# Patient Record
Sex: Male | Born: 1980 | Race: White | Hispanic: No | Smoking: Former smoker
Health system: Southern US, Community
[De-identification: ages and names within clinical notes are randomized; demographics above are authoritative.]

## PROBLEM LIST (undated history)

## (undated) DIAGNOSIS — E785 Hyperlipidemia, unspecified: Secondary | ICD-10-CM

## (undated) DIAGNOSIS — I10 Essential (primary) hypertension: Secondary | ICD-10-CM

## (undated) DIAGNOSIS — J301 Allergic rhinitis due to pollen: Secondary | ICD-10-CM

## (undated) HISTORY — DX: Allergic rhinitis due to pollen: J30.1

## (undated) HISTORY — DX: Essential (primary) hypertension: I10

## (undated) HISTORY — DX: Hyperlipidemia, unspecified: E78.5

---

## 2000-02-21 ENCOUNTER — Emergency Department (HOSPITAL_COMMUNITY): Admission: EM | Admit: 2000-02-21 | Discharge: 2000-02-21 | Payer: Self-pay | Admitting: Emergency Medicine

## 2000-02-21 ENCOUNTER — Encounter: Payer: Self-pay | Admitting: *Deleted

## 2000-04-19 HISTORY — PX: KNEE ARTHROSCOPY: SUR90

## 2000-05-20 LAB — HM HEPATITIS C SCREENING LAB: HM Hepatitis Screen: POSITIVE

## 2000-05-20 LAB — HM HIV SCREENING LAB: HM HIV Screening: NEGATIVE

## 2004-05-12 ENCOUNTER — Encounter: Admission: RE | Admit: 2004-05-12 | Discharge: 2004-06-24 | Payer: Self-pay | Admitting: Internal Medicine

## 2004-06-19 ENCOUNTER — Ambulatory Visit: Payer: Self-pay | Admitting: Internal Medicine

## 2005-01-02 ENCOUNTER — Ambulatory Visit: Payer: Self-pay | Admitting: Internal Medicine

## 2005-06-05 ENCOUNTER — Ambulatory Visit: Payer: Self-pay | Admitting: Family Medicine

## 2007-07-28 ENCOUNTER — Telehealth (INDEPENDENT_AMBULATORY_CARE_PROVIDER_SITE_OTHER): Payer: Self-pay | Admitting: *Deleted

## 2007-07-29 ENCOUNTER — Ambulatory Visit: Payer: Self-pay | Admitting: Internal Medicine

## 2007-07-29 LAB — CONVERTED CEMR LAB
Bilirubin Urine: NEGATIVE
Blood in Urine, dipstick: NEGATIVE
Glucose, Urine, Semiquant: NEGATIVE
Ketones, urine, test strip: NEGATIVE
Nitrite: NEGATIVE
Specific Gravity, Urine: >=1.03
Urobilinogen, UA: 0.2
WBC Urine, dipstick: NEGATIVE
pH: 5

## 2007-09-27 ENCOUNTER — Ambulatory Visit: Payer: Self-pay | Admitting: Internal Medicine

## 2007-09-27 LAB — CONVERTED CEMR LAB
Albumin: 4.1 g/dL (ref 3.5–5.2)
BUN: 8 mg/dL (ref 6–23)
CO2: 29 meq/L (ref 19–32)
Calcium: 9.8 mg/dL (ref 8.4–10.5)
Chloride: 104 meq/L (ref 96–112)
Creatinine, Ser: 1 mg/dL (ref 0.4–1.5)
GFR calc Af Amer: 114 mL/min
GFR calc non Af Amer: 95 mL/min
Glucose, Bld: 86 mg/dL (ref 70–99)
Phosphorus: 4.2 mg/dL (ref 2.3–4.6)
Potassium: 4.1 meq/L (ref 3.5–5.1)
Sodium: 141 meq/L (ref 135–145)

## 2007-12-05 ENCOUNTER — Emergency Department (HOSPITAL_COMMUNITY): Admission: EM | Admit: 2007-12-05 | Discharge: 2007-12-05 | Payer: Self-pay | Admitting: Family Medicine

## 2008-03-27 ENCOUNTER — Ambulatory Visit: Payer: Self-pay | Admitting: Internal Medicine

## 2008-10-09 ENCOUNTER — Telehealth (INDEPENDENT_AMBULATORY_CARE_PROVIDER_SITE_OTHER): Payer: Self-pay | Admitting: *Deleted

## 2008-10-12 ENCOUNTER — Ambulatory Visit: Payer: Self-pay | Admitting: Internal Medicine

## 2012-07-15 ENCOUNTER — Telehealth: Payer: Self-pay | Admitting: Internal Medicine

## 2012-07-15 NOTE — Telephone Encounter (Signed)
Patient advised.

## 2012-07-15 NOTE — Telephone Encounter (Signed)
Patient Information:  Caller Name: Jerry Leach  Phone: [phone]  Patient: Jerry Leach,   Gender: Male  DOB: 11-01-1980  Age: 31 Years  PCP: [facility])  Office Follow Up:  Does the office need to follow up with this patient?: No  Instructions For The Office: N/A  RN Note:  Advised that Shingles Vaccine not recommended in this case. No direct exposure to rash and healthy young adult.  Symptoms  Reason For Call & Symptoms: Calling to find out if he is at risk for contracting Shingles from his wife who was started with the rash on 07/12/12. Wondering if he needs Shingles Vaccine. He has had Chicken Pox as a child.  Reviewed Health History In EMR: Yes  Reviewed Medications In EMR: Yes  Reviewed Allergies In EMR: Yes  Reviewed Surgeries / Procedures: Yes  Date of Onset of Symptoms: 07/15/2012  Guideline(s) Used:  No Protocol Available - Information Only  Disposition Per Guideline:   Home Care  Reason For Disposition Reached:   Information only question and nurse able to answer  Advice Given:  N/A

## 2012-07-15 NOTE — Telephone Encounter (Signed)
Please let him know that you can only catch chicken pox from someone with shingles if you haven't had it yourself. You don't catch shingles from someone (it is your own recurrence in one spot of your old chicken pox infection)

## 2013-08-21 ENCOUNTER — Emergency Department (HOSPITAL_COMMUNITY)
Admission: EM | Admit: 2013-08-21 | Discharge: 2013-08-21 | Disposition: A | Discharge status: Home / Self Care | Payer: 59 | Source: Home / Self Care | Attending: Family Medicine | Admitting: Family Medicine

## 2013-08-21 ENCOUNTER — Encounter (HOSPITAL_COMMUNITY): Payer: Self-pay | Admitting: Emergency Medicine

## 2013-08-21 DIAGNOSIS — J329 Chronic sinusitis, unspecified: Secondary | ICD-10-CM

## 2013-08-21 DIAGNOSIS — B9789 Other viral agents as the cause of diseases classified elsewhere: Secondary | ICD-10-CM

## 2013-08-21 MED ORDER — AZITHROMYCIN 250 MG PO TABS: 250.0000 mg | ORAL_TABLET | Freq: Every day | ORAL | Status: DC

## 2013-08-21 MED ORDER — PREDNISONE 10 MG PO TABS: 30.0000 mg | ORAL_TABLET | Freq: Every day | ORAL | Status: DC

## 2013-08-21 MED ORDER — IPRATROPIUM BROMIDE 0.06 % NA SOLN: 2.0000 | Freq: Four times a day (QID) | NASAL | Status: DC

## 2013-08-21 NOTE — ED Provider Notes (Signed)
Jerry Leach is a 33 y.o. male who presents to Urgent Care today for sinus pain and pressure and nasal congestion for the past one week. Seems to be worsening recently. Patient is trying Claritin which has not helped very much. 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 He denies any significant sore throat or cough nausea vomiting diarrhea or trouble breathing.   History reviewed. No pertinent past medical history. History  Substance Use Topics  . Smoking status: Never Smoker   . Smokeless tobacco: Not on file  . Alcohol Use: Yes   ROS as above Medications: No current facility-administered medications for this encounter.   Current Outpatient Prescriptions  Medication Sig Dispense Refill  . azithromycin (ZITHROMAX) 250 MG tablet Take 1 tablet (250 mg total) by mouth daily. Take first 2 tablets together, then 1 every day until finished.  6 tablet  0  . ipratropium (ATROVENT) 0.06 % nasal spray Place 2 sprays into both nostrils 4 (four) times daily.  15 mL  1  . predniSONE (DELTASONE) 10 MG tablet Take 3 tablets (30 mg total) by mouth daily.  15 tablet  0    Exam:  BP 155/89  Pulse 85  Temp(Src) 98.3 F (36.8 C) (Oral)  Resp 16  SpO2 99% Gen: Well NAD HEENT: EOMI,  MMM nontender maxillary sinuses. Tympanic membranes are normal appearing bilaterally. Posterior pharynx with cobblestoning. Lungs: Normal work of breathing. CTABL Heart: RRR no MRG Abd: NABS, Soft. NT, ND Exts: Brisk capillary refill, warm and well perfused.    Assessment and Plan: 33 y.o. male with likely viral sinusitis. Plan to treat with prednisone and Atrovent nasal spray. If not improving we'll use azithromycin.  Discussed warning signs or symptoms. Please see discharge instructions. Patient expresses understanding.    Rodolph BongEvan S Patricie Geeslin, MD 08/21/13 [id number]

## 2013-08-21 NOTE — Discharge Instructions (Signed)
Thank you for coming in today. Take prednisone daily for 5 days. Use Atrovent nasal spray. If not getting better start taking azithromycin. Call or go to the emergency room if you get worse, have trouble breathing, have chest pains, or palpitations.

## 2013-08-21 NOTE — ED Notes (Signed)
C/o sinus pressure and pain x 7 days.  States "can't breathe out of nose".  Denies fever and any other symptoms.  No relief with otc meds.

## 2013-12-05 ENCOUNTER — Ambulatory Visit
Admission: RE | Admit: 2013-12-05 | Discharge: 2013-12-05 | Disposition: A | Discharge status: Home / Self Care | Payer: Worker's Compensation | Source: Ambulatory Visit | Attending: Family | Admitting: Family

## 2013-12-05 ENCOUNTER — Other Ambulatory Visit: Payer: Self-pay | Admitting: Family

## 2013-12-05 DIAGNOSIS — M79641 Pain in right hand: Secondary | ICD-10-CM

## 2014-03-07 ENCOUNTER — Ambulatory Visit (INDEPENDENT_AMBULATORY_CARE_PROVIDER_SITE_OTHER): Payer: 59 | Admitting: Internal Medicine

## 2014-03-07 ENCOUNTER — Encounter: Payer: Self-pay | Admitting: Internal Medicine

## 2014-03-07 ENCOUNTER — Telehealth: Payer: Self-pay | Admitting: Internal Medicine

## 2014-03-07 VITALS — BP 180/100 | HR 87 | Temp 98.6°F | Ht 69.5 in | Wt 222.0 lb

## 2014-03-07 DIAGNOSIS — Z23 Encounter for immunization: Secondary | ICD-10-CM

## 2014-03-07 DIAGNOSIS — I10 Essential (primary) hypertension: Secondary | ICD-10-CM | POA: Insufficient documentation

## 2014-03-07 DIAGNOSIS — Z0000 Encounter for general adult medical examination without abnormal findings: Secondary | ICD-10-CM | POA: Insufficient documentation

## 2014-03-07 DIAGNOSIS — J301 Allergic rhinitis due to pollen: Secondary | ICD-10-CM | POA: Insufficient documentation

## 2014-03-07 MED ORDER — LOSARTAN POTASSIUM-HCTZ 100-25 MG PO TABS: 1.0000 | ORAL_TABLET | Freq: Every day | ORAL | Status: DC

## 2014-03-07 NOTE — Patient Instructions (Addendum)
Please start the blood pressure medication today. Cut the first 7 in half and take half the tab daily for the first 2 weeks. If no problems with dizziness or fatigue, then increase to the full tab.  DASH Eating Plan DASH stands for "Dietary Approaches to Stop Hypertension." The DASH eating plan is a healthy eating plan that has been shown to reduce high blood pressure (hypertension). Additional health benefits may include reducing the risk of type 2 diabetes mellitus, heart disease, and stroke. The DASH eating plan may also help with weight loss. WHAT DO I NEED TO KNOW ABOUT THE DASH EATING PLAN? For the DASH eating plan, you will follow these general guidelines:  Choose foods with a percent daily value for sodium of less than 5% (as listed on the food label).  Use salt-free seasonings or herbs instead of table salt or sea salt.  Check with your health care provider or pharmacist before using salt substitutes.  Eat lower-sodium products, often labeled as "lower sodium" or "no salt added."  Eat fresh foods.  Eat more vegetables, fruits, and low-fat dairy products.  Choose whole grains. Look for the word "whole" as the first word in the ingredient list.  Choose fish and skinless chicken or Malaw[place]key more often than red meat. Limit fish, poultry, and meat to 6 oz (170 g) each day.  Limit sweets, desserts, sugars, and sugary drinks.  Choose heart-healthy fats.  Limit cheese to 1 oz (28 g) per day.  Eat more home-cooked food and less restaurant, buffet, and fast food.  Limit fried foods.  Cook foods using methods other than frying.  Limit canned vegetables. If you do use them, rinse them well to decrease the sodium.  When eating at a restaurant, ask that your food be prepared with less salt, or no salt if possible. WHAT FOODS CAN I EAT? Seek help from a dietitian for individual calorie needs. Grains Whole grain or whole wheat bread. Brown rice. Whole grain or whole wheat pasta.  Quinoa, bulgur, and whole grain cereals. Low-sodium cereals. Corn or whole wheat flour tortillas. Whole grain cornbread. Whole grain crackers. Low-sodium crackers. Vegetables Fresh or frozen vegetables (raw, steamed, roasted, or grilled). Low-sodium or reduced-sodium tomato and vegetable juices. Low-sodium or reduced-sodium tomato sauce and paste. Low-sodium or reduced-sodium canned vegetables.  Fruits All fresh, canned (in natural juice), or frozen fruits. Meat and Other Protein Products Ground beef (85% or leaner), grass-fed beef, or beef trimmed of fat. Skinless chicken or Mal[place]urkey. Ground chicken or [place]urkey. Pork trimmed of fat. All fish and seafood. Eggs. Dried beans, peas, or lentils. Unsalted nuts and seeds. Unsalted canned beans. Dairy Low-fat dairy products, such as skim or 1% milk, 2% or reduced-fat cheeses, low-fat ricotta or cottage cheese, or plain low-fat yogurt. Low-sodium or reduced-sodium cheeses. Fats and Oils Tub margarines without trans fats. Light or reduced-fat mayonnaise and salad dressings (reduced sodium). Avocado. Safflower, olive, or canola oils. Natural peanut or almond butter. Other Unsalted popcorn and pretzels. The items listed above may not be a complete list of recommended foods or beverages. Contact your dietitian for more options. WHAT FOODS ARE NOT RECOMMENDED? Grains White bread. White pasta. White rice. Refined cornbread. Bagels and croissants. Crackers that contain trans fat. Vegetables Creamed or fried vegetables. Vegetables in a cheese sauce. Regular canned vegetables. Regular canned tomato sauce and paste. Regular tomato and vegetable juices. Fruits Dried fruits. Canned fruit in light or heavy syrup. Fruit juice. Meat and Other Protein Products Fatty cuts of meat. Ribs,  chicken wings, bacon, sausage, bologna, salami, chitterlings, fatback, hot dogs, bratwurst, and packaged luncheon meats. Salted nuts and seeds. Canned beans with salt. Dairy Whole or 2%  milk, cream, half-and-half, and cream cheese. Whole-fat or sweetened yogurt. Full-fat cheeses or blue cheese. Nondairy creamers and whipped toppings. Processed cheese, cheese spreads, or cheese curds. Condiments Onion and garlic salt, seasoned salt, table salt, and sea salt. Canned and packaged gravies. Worcestershire sauce. Tartar sauce. Barbecue sauce. Teriyaki sauce. Soy sauce, including reduced sodium. Steak sauce. Fish sauce. Oyster sauce. Cocktail sauce. Horseradish. Ketchup and mustard. Meat flavorings and tenderizers. Bouillon cubes. Hot sauce. Tabasco sauce. Marinades. Taco seasonings. Relishes. Fats and Oils Butter, stick margarine, lard, shortening, ghee, and bacon fat. Coconut, palm kernel, or palm oils. Regular salad dressings. Other Pickles and olives. Salted popcorn and pretzels. The items listed above may not be a complete list of foods and beverages to avoid. Contact your dietitian for more information. WHERE CAN I FIND MORE INFORMATION? National Heart, Lung, and Blood Institute: travelstabloid.com Document Released: 06/25/2011 Document Revised: 11/20/2013 Document Reviewed: 05/10/2013 [facility] Patient Information 2015 [place], [place]. This information is not intended to replace advice given to you by your health care provider. Make sure you discuss any questions you have with your health care provider.

## 2014-03-07 NOTE — Assessment & Plan Note (Signed)
Generally healthy Tdap updated DASH diet info given

## 2014-03-07 NOTE — Telephone Encounter (Signed)
Relevant patient education mailed to patient.  

## 2014-03-07 NOTE — Progress Notes (Signed)
Subjective:    Patient ID: Jerry Leach, male    DOB: 10-23-1980, 33 y.o.   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   No current facility-administered medications on file prior to visit.    No Known Allergies  Past Medical History  Diagnosis Date  . Hypertension   . Allergic rhinitis due to pollen     Past Surgical History  Procedure Laterality Date  . Knee arthroscopy Left 10/01    Family History  Problem Relation Age of Onset  . Hypertension Mother   . Heart disease Father   . Cancer Neg Hx   . Diabetes Neg Hx   . Heart disease Other   . Hypertension Other     History   Social History  . Marital Status: Married    Spouse Name: N/A    Number of Children: 2  . Years of Education: N/A   Occupational History  . Firefighter     [employer]    Social History Main Topics  . Smoking status: Never Smoker   . Smokeless tobacco: Current User    Types: Snuff  . Alcohol Use: Yes  . Drug Use: No  . Sexual Activity: Yes   Other Topics Concern  . Not on file   Social History Narrative  . No narrative on file   Review of Systems  Constitutional: Negative for fatigue and unexpected weight change.       Wears seat belt  HENT: Negative for dental problem, hearing loss and tinnitus.        Regular with dentist  Eyes: Negative for visual disturbance.       No diplopia or unilateral vision loss  Respiratory: Negative for cough, chest tightness and shortness of breath.   Cardiovascular: Negative for chest pain, palpitations and leg swelling.  Gastrointestinal: Negative for nausea, vomiting,  abdominal pain, constipation and blood in stool.       No heartburn  Endocrine: Negative for cold intolerance and heat intolerance.  Genitourinary: Negative for urgency, frequency and difficulty urinating.       No sexual problems  Musculoskeletal: Positive for back pain. Negative for arthralgias and joint swelling.       Some back pain after working out  Skin: Negative for rash.       No suspicious lesions  Allergic/Immunologic: Positive for environmental allergies. Negative for immunocompromised state.  Neurological: Negative for dizziness, syncope, weakness, light-headedness, numbness and headaches.  Hematological: Negative for adenopathy. Does not bruise/bleed easily.  Psychiatric/Behavioral: Negative for sleep disturbance and dysphoric mood. The patient is not nervous/anxious.        Objective:   Physical Exam  Constitutional: He is oriented to person, place, and time. He appears well-developed and well-nourished. No distress.  HENT:  Head: Normocephalic and atraumatic.  Right Ear: External ear normal.  Left Ear: External ear normal.  Mouth/Throat: No oropharyngeal exudate.  Eyes: Conjunctivae and EOM are normal. Pupils are equal, round, and reactive to light.  Neck: Normal range of motion. Neck supple. No thyromegaly present.  Cardiovascular: Normal rate, regular rhythm, normal heart sounds and intact distal pulses.  Exam reveals no gallop.   No murmur heard. Pulmonary/Chest: Effort normal and breath sounds normal. No respiratory distress. He has no wheezes. He has no rales.  Abdominal: Soft. There is no tenderness.  Musculoskeletal: He exhibits no edema and no tenderness.  Lymphadenopathy:    He has no cervical adenopathy.  Neurological: He is alert and oriented to person, place, and time.  Skin: No rash noted. No erythema.  Psychiatric: He has a normal mood and affect. His behavior is normal.          Assessment & Plan:

## 2014-03-07 NOTE — Progress Notes (Signed)
Pre visit review using our clinic review tool, if applicable. No additional management support is needed unless otherwise documented below in the visit note. 

## 2014-03-07 NOTE — Assessment & Plan Note (Signed)
BP Readings from Last 3 Encounters:  03/07/14 180/100  08/21/13 155/89  10/12/08 126/82   Repeat 156/114 on right Will start losartan/HCT and recheck in 6-8 weeks

## 2014-05-09 ENCOUNTER — Ambulatory Visit (INDEPENDENT_AMBULATORY_CARE_PROVIDER_SITE_OTHER): Payer: 59 | Admitting: Internal Medicine

## 2014-05-09 ENCOUNTER — Encounter: Payer: Self-pay | Admitting: Internal Medicine

## 2014-05-09 VITALS — BP 140/98 | HR 80 | Temp 97.9°F | Wt 230.0 lb

## 2014-05-09 DIAGNOSIS — I10 Essential (primary) hypertension: Secondary | ICD-10-CM

## 2014-05-09 DIAGNOSIS — Z23 Encounter for immunization: Secondary | ICD-10-CM

## 2014-05-09 LAB — RENAL FUNCTION PANEL
Albumin: 3.7 g/dL (ref 3.5–5.2)
BUN: 13 mg/dL (ref 6–23)
CO2: 27 meq/L (ref 19–32)
Calcium: 9.5 mg/dL (ref 8.4–10.5)
Chloride: 102 mEq/L (ref 96–112)
Creatinine, Ser: 1.1 mg/dL (ref 0.4–1.5)
GFR: 85.59 mL/min (ref >60.00)
Glucose, Bld: 90 mg/dL (ref 70–99)
Phosphorus: 4 mg/dL (ref 2.3–4.6)
Potassium: 4.1 mEq/L (ref 3.5–5.1)
SODIUM: 138 meq/L (ref 135–145)

## 2014-05-09 MED ORDER — MOMETASONE FUROATE 50 MCG/ACT NA SUSP: 4.0000 | Freq: Every day | NASAL | Status: DC

## 2014-05-09 NOTE — Assessment & Plan Note (Signed)
BP Readings from Last 3 Encounters:  05/09/14 140/98  03/07/14 180/100  08/21/13 155/89   Much better Tolerating the medication Will check renal profile--getting full blood work through city soon He will monitor BP intermittently at [employer]

## 2014-05-09 NOTE — Progress Notes (Signed)
Pre visit review using our clinic review tool, if applicable. No additional management support is needed unless otherwise documented below in the visit note. 

## 2014-05-09 NOTE — Addendum Note (Signed)
Addended by: Sueanne MargaritaSMITH, DESHANNON L on: 05/09/2014 01:11 PM   Modules accepted: Orders

## 2014-05-09 NOTE — Progress Notes (Signed)
   Subjective:    Patient ID: Jerry Leach, male    DOB: 10-Jul-1981, 33 y.o.   MRN: [id number]  HPI Doing well  No problems with the medication Feels better Occasionally checking BP-- usually 130's/ 80-90's Stress with school is finally over--- just graduated  Weight is up but has heavy shoes and full pockets of equipment  No chest pain No dizziness No throat soreness  Current Outpatient Prescriptions on File Prior to Visit  Medication Sig Dispense Refill  . loratadine (CLARITIN) 10 MG tablet Take 10 mg by mouth daily.      Marland Kitchen. losartan-hydrochlorothiazide (HYZAAR) 100-25 MG per tablet Take 1 tablet by mouth daily.  90 tablet  3   No current facility-administered medications on file prior to visit.    No Known Allergies  Past Medical History  Diagnosis Date  . Hypertension   . Allergic rhinitis due to pollen     Past Surgical History  Procedure Laterality Date  . Knee arthroscopy Left 10/01    Family History  Problem Relation Age of Onset  . Hypertension Mother   . Heart disease Father   . Cancer Neg Hx   . Diabetes Neg Hx   . Heart disease Other   . Hypertension Other     History   Social History  . Marital Status: Married    Spouse Name: N/A    Number of Children: 2  . Years of Education: N/A   Occupational History  . [employer]fighter     [place]   . 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 Smoking status: Never Smoker   . Smokeless tobacco: Current User    Types: Snuff  . Alcohol Use: Yes  . Drug Use: No  . Sexual Activity: Yes   Other Topics Concern  . Not on file   Social History Narrative  . No narrative on file   Review of Systems Has some cough--relates to drainage from sinuses Especially notes with mowing or working on leaves    Objective:   Physical Exam  Constitutional: He appears well-developed and well-nourished. No distress.  Neck: Normal range of motion. Neck supple. No thyromegaly present.    Cardiovascular: Normal rate, regular rhythm and normal heart sounds.  Exam reveals no gallop.   No murmur heard. Pulmonary/Chest: Effort normal and breath sounds normal. No respiratory distress. He has no wheezes. He has no rales.  Musculoskeletal: He exhibits no edema and no tenderness.  Lymphadenopathy:    He has no cervical adenopathy.  Psychiatric: He has a normal mood and affect. His behavior is normal.          Assessment & Plan:

## 2014-05-10 ENCOUNTER — Telehealth: Payer: Self-pay | Admitting: Internal Medicine

## 2014-05-10 NOTE — Telephone Encounter (Signed)
emmi mailed  °

## 2014-05-11 ENCOUNTER — Encounter: Payer: Self-pay | Admitting: *Deleted

## 2014-07-25 ENCOUNTER — Other Ambulatory Visit: Payer: Self-pay | Admitting: *Deleted

## 2014-07-25 MED ORDER — FLUTICASONE PROPIONATE 50 MCG/ACT NA SUSP: 2.0000 | Freq: Every day | NASAL | Status: DC

## 2015-02-15 ENCOUNTER — Emergency Department (HOSPITAL_COMMUNITY): Payer: 59

## 2015-02-15 ENCOUNTER — Inpatient Hospital Stay (HOSPITAL_COMMUNITY)
Admission: EM | Admit: 2015-02-15 | Discharge: 2015-02-15 | DRG: 392 | Disposition: A | Discharge status: Home / Self Care | Payer: 59 | Attending: Emergency Medicine | Admitting: Emergency Medicine

## 2015-02-15 ENCOUNTER — Telehealth: Payer: Self-pay | Admitting: Internal Medicine

## 2015-02-15 ENCOUNTER — Encounter (HOSPITAL_COMMUNITY): Payer: Self-pay | Admitting: *Deleted

## 2015-02-15 DIAGNOSIS — K5792 Diverticulitis of intestine, part unspecified, without perforation or abscess without bleeding: Principal | ICD-10-CM | POA: Diagnosis present

## 2015-02-15 DIAGNOSIS — J309 Allergic rhinitis, unspecified: Secondary | ICD-10-CM | POA: Diagnosis present

## 2015-02-15 DIAGNOSIS — I10 Essential (primary) hypertension: Secondary | ICD-10-CM | POA: Diagnosis present

## 2015-02-15 LAB — CBC
HEMATOCRIT: 40.9 % (ref 39.0–52.0)
Hemoglobin: 14.1 g/dL (ref 13.0–17.0)
MCH: 30.7 pg (ref 26.0–34.0)
MCHC: 34.5 g/dL (ref 30.0–36.0)
MCV: 88.9 fL (ref 78.0–100.0)
Platelets: 262 10*3/uL (ref 150–400)
RBC: 4.6 MIL/uL (ref 4.22–5.81)
RDW: 12.4 % (ref 11.5–15.5)
WBC: 8.3 10*3/uL (ref 4.0–10.5)

## 2015-02-15 LAB — COMPREHENSIVE METABOLIC PANEL
ALK PHOS: 38 U/L (ref 38–126)
ALT: 24 U/L (ref 17–63)
AST: 19 U/L (ref 15–41)
Albumin: 3.8 g/dL (ref 3.5–5.0)
Anion gap: 8 (ref 5–15)
BILIRUBIN TOTAL: 0.5 mg/dL (ref 0.3–1.2)
BUN: 9 mg/dL (ref 6–20)
CO2: 27 mmol/L (ref 22–32)
Calcium: 9.3 mg/dL (ref 8.9–10.3)
Chloride: 104 mmol/L (ref 101–111)
Creatinine, Ser: 0.95 mg/dL (ref 0.61–1.24)
GFR calc non Af Amer: >60 mL/min (ref >60)
Glucose, Bld: 88 mg/dL (ref 65–99)
Potassium: 3.6 mmol/L (ref 3.5–5.1)
Sodium: 139 mmol/L (ref 135–145)
TOTAL PROTEIN: 7.5 g/dL (ref 6.5–8.1)

## 2015-02-15 LAB — URINALYSIS, ROUTINE W REFLEX MICROSCOPIC
Bilirubin Urine: NEGATIVE
GLUCOSE, UA: NEGATIVE mg/dL
Hgb urine dipstick: NEGATIVE
KETONES UR: NEGATIVE mg/dL
Leukocytes, UA: NEGATIVE
NITRITE: NEGATIVE
PH: 6.5 (ref 5.0–8.0)
PROTEIN: NEGATIVE mg/dL
SPECIFIC GRAVITY, URINE: 1.028 (ref 1.005–1.030)
Urobilinogen, UA: 0.2 mg/dL (ref 0.0–1.0)

## 2015-02-15 LAB — LIPASE, BLOOD: LIPASE: 20 U/L — AB (ref 22–51)

## 2015-02-15 MED ORDER — SODIUM CHLORIDE 0.9 % IV SOLN: INTRAVENOUS | Status: DC

## 2015-02-15 MED ORDER — HYDROCODONE-ACETAMINOPHEN 5-325 MG PO TABS: 1.0000 | ORAL_TABLET | ORAL | Status: DC | PRN

## 2015-02-15 MED ORDER — HEPARIN SODIUM (PORCINE) 5000 UNIT/ML IJ SOLN: 5000.0000 [IU] | Freq: Three times a day (TID) | INTRAMUSCULAR | Status: DC

## 2015-02-15 MED ORDER — CLINDAMYCIN PHOSPHATE 600 MG/50ML IV SOLN: 600.0000 mg | Freq: Three times a day (TID) | INTRAVENOUS | Status: DC

## 2015-02-15 MED ORDER — IOHEXOL 300 MG/ML  SOLN
100.0000 mL | Freq: Once | INTRAMUSCULAR | Status: AC | PRN
  Administered 2015-02-15: 100 mL via INTRAVENOUS

## 2015-02-15 MED ORDER — IOHEXOL 300 MG/ML  SOLN
25.0000 mL | Freq: Once | INTRAMUSCULAR | Status: AC | PRN
  Administered 2015-02-15: 25 mL via ORAL

## 2015-02-15 MED ORDER — SODIUM CHLORIDE 0.9 % IV BOLUS (SEPSIS)
1000.0000 mL | Freq: Once | INTRAVENOUS | Status: AC
  Administered 2015-02-15: 1000 mL via INTRAVENOUS

## 2015-02-15 MED ORDER — AMOXICILLIN-POT CLAVULANATE 875-125 MG PO TABS: 1.0000 | ORAL_TABLET | Freq: Two times a day (BID) | ORAL | Status: DC

## 2015-02-15 NOTE — Discharge Instructions (Signed)

## 2015-02-15 NOTE — Telephone Encounter (Signed)
Patient Name: Jerry Leach RE DOB: 1980/10/25 Initial Comment Caller states he has some right side abdominal pain and the abdomen is very hard. He has a fever. Appears to be a bulge Nurse Assessment Nurse: Loletta Specter, RN, Misty Stanley Date/Time Lamount Cohen Time): 02/15/2015 4:52:11 PM Confirm and document reason for call. If symptomatic, describe symptoms. ---Caller states he has right side abdominal pain. Fever started yesterday. Abdomen is swollen and hard. No vomiting or diarrhea. Normal Bm today. Has the patient traveled out of the country within the last 30 days? ---No Does the patient require triage? ---Yes Related visit to physician within the last 2 weeks? ---No Does the PT have any chronic conditions? (i.e. diabetes, asthma, etc.) ---No Guidelines Guideline Title Affirmed Question Affirmed Notes Abdominal Pain - Male [1] SEVERE pain (e.g., excruciating) AND [2] present > 1 hour pain level 7/10 Final Disposition User Go to ED Now Loletta Specter, RN, Misty Stanley Referrals [facility] - ED Disagree/Comply: Comply

## 2015-02-15 NOTE — ED Provider Notes (Signed)
CSN: [id number]     Arrival date & time 02/15/15  1741 History   First MD Initiated Contact with Patient 02/15/15 1904     Chief Complaint  Patient presents with  . Abdominal Pain     Patient is a 34 y.o. male presenting with abdominal pain. The history is provided by the patient. No language interpreter was used.  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 He developed pain 2 days ago. The pain is waxing and waning in nature. When it is present is constant and intense. This located in the right lower quadrant and right mid abdomen. His symptoms started out with nausea, body aches, fever to 101. Now he has no residual nausea, vomiting, fever, dysuria, diarrhea, constipation. No history of recent illness or injuries otherwise. He has a history of high blood pressure but no other medical problems, no prior abdominal surgeries. Pain occasionally radiates to the left lower quadrant.  Past Medical History  Diagnosis Date  . Hypertension   . Allergic rhinitis due to pollen    Past Surgical History  Procedure Laterality Date  . Knee arthroscopy Left 10/01   Family History  Problem Relation Age of Onset  . Hypertension Mother   . Heart disease Father   . Cancer Neg Hx   . Diabetes Neg Hx   . Heart disease Other   . Hypertension Other    History  Substance Use Topics  . Smoking status: Never Smoker   . Smokeless tobacco: Current User    Types: Snuff  . Alcohol Use: Yes    Review of Systems  Gastrointestinal: Positive for abdominal pain.  All other systems reviewed and are negative.     Allergies  Review of patient's allergies indicates no known allergies.  Home Medications   Prior to Admission medications   Medication Sig Start Date End Date Taking? Authorizing Provider  losartan-hydrochlorothiazide (HYZAAR) 100-25 MG per tablet Take 1 tablet by mouth daily. 03/07/14  Yes Karie Schwalbe, MD  omega-3 acid ethyl  esters (LOVAZA) 1 G capsule Take 1 g by mouth 2 (two) times daily.   Yes Historical Provider, MD  fluticasone (FLONASE) 50 MCG/ACT nasal spray Place 2 sprays into both nostrils daily. Patient not taking: Reported on 02/15/2015 07/25/14   Karie Schwalbe, MD   BP 143/89 mmHg  Pulse 88  Temp(Src) 98.4 F (36.9 C) (Oral)  Resp 16  SpO2 100% Physical Exam  Constitutional: He is oriented to person, place, and time. He appears well-developed and well-nourished.  HENT:  Head: Normocephalic and atraumatic.  Cardiovascular: Normal rate and regular rhythm.   No murmur heard. Pulmonary/Chest: Effort normal and breath sounds normal. No respiratory distress.  Abdominal: Soft. Bowel sounds are normal. There is no rebound and no guarding.  Moderate right mid and lower abdominal tenderness.  Musculoskeletal: He exhibits no edema or tenderness.  Neurological: He is alert and oriented to person, place, and time.  Skin: Skin is warm and dry.  Psychiatric: He has a normal mood and affect. His behavior is normal.  Nursing note and vitals reviewed.   ED Course  Procedures (including critical care time) Labs Review Labs Reviewed  LIPASE, BLOOD - Abnormal; Notable for the following:    Lipase 20 (*)    All other components within normal limits  COMPREHENSIVE METABOLIC PANEL  CBC  URINALYSIS, ROUTINE W REFLEX MICROSCOPIC (NOT AT [facility])    Imaging Review Ct Abdomen Pelvis W  Contrast  02/15/2015   CLINICAL DATA:  Right lower quadrant pain 3 days.  EXAM: CT ABDOMEN AND PELVIS WITH CONTRAST  TECHNIQUE: Multidetector CT imaging of the abdomen and pelvis was performed using the standard protocol following bolus administration of intravenous contrast.  CONTRAST:  OMNIPAQUE IOHEXOL 300 MG/ML  SOLN  COMPARISON:  None.  FINDINGS: Lung bases are within normal.  Abdominal images demonstrate a normal liver, spleen, pancreas, gallbladder and adrenal glands. Kidneys are normal in size without hydronephrosis or  nephrolithiasis. Ureters are normal. The appendix is normal.  There is diverticulosis of the colon. There is an inflamed diverticula over the ascending colon just above the ileocecal junction with adjacent mesenteric inflammation and minimal fluid. Findings compatible of mild acute diverticulitis. No evidence of perforation or abscess.  Vascular structures are within normal.  Small bowel is normal.  Pelvic images demonstrate a normal bladder, prostate and rectum. There is minimal degenerative change of the spine.  IMPRESSION: Evidence of mild acute diverticulitis of the ascending colon just above the ileocecal junction. No evidence of perforation or abscess. Normal appendix.   Electronically Signed   By: Elberta Fortis M.D.   On: 02/15/2015 20:41     EKG Interpretation None      MDM   Final diagnoses:  Acute diverticulitis    Patient here for evaluation of right lower quadrant abdominal pain. CT abdomen and obtained to evaluate for possible appendicitis. CT demonstrates acute diverticulitis without any, gait features. Discussed with patient findings a study and need for treatment with antibiotics. Discussed home care, return precautions, GI follow-up.    Tilden Fossa, MD 02/16/15 Perlie Mayo

## 2015-02-15 NOTE — ED Notes (Signed)
The pt is c/o rt upper abd pain for 3 days.  No n v or diarrhea he has had a temp  He reports

## 2015-02-18 ENCOUNTER — Telehealth: Payer: Self-pay | Admitting: *Deleted

## 2015-02-18 NOTE — Telephone Encounter (Signed)
Spoke with patient and he's doing ok, he does have follow-up with GI for diverticulitis

## 2015-02-18 NOTE — Telephone Encounter (Signed)
Pt seen [place] 02/15/15.

## 2015-02-18 NOTE — Telephone Encounter (Signed)
PLEASE NOTE: All timestamps contained within this report are represented as [place] Standard Time. CONFIDENTIALTY NOTICE: This fax transmission is intended only for the addressee. It contains information that is legally privileged, confidential or otherwise protected from use or disclosure. If you are not the intended recipient, you are strictly prohibited from reviewing, disclosing, copying using or disseminating any of this information or taking any action in reliance on or regarding this information. If you have received this fax in error, please notify us immediately by telephone so that we can arrange for its return to [place]. Phone: [phone], Toll-Free: [phone], Fax: [phone] Page: 1 of 2 Call Id: [id number] [facility] Day - Client TELEPHONE ADVICE RECORD [facility] Call [facility] Patient Name: Jerry Leach Gender: Male DOB: [birth year] Age: 34 Y 5 M 16 D Return Phone Number: [phone] (Primary), [phone] (Secondary) Address[place] [address] Client [facility] Day - Client Client Site [facility] [place] - Day Physician Tillman Abide Contact Type Call Call Type Triage / Clinical Relationship To Patient Self Appointment Disposition EMR Appointment Not Necessary Info pasted into Epic Yes Return Phone Number [phone] (Primary) Chief Complaint ABDOMINAL PAIN - Severe and only in abdomen Initial Comment Caller states he has some right side abdominal pain and the abdomen is very hard. He has a fever. Appears to be a bulge PreDisposition Home Care Nurse Assessment Nurse: Loletta Specter, RN, Misty Stanley Date/Time Lamount Cohen Time): 02/15/2015 4:52:11 PM Confirm and document reason for call. If symptomatic, describe symptoms. ---Caller states he has right side abdominal pain. Fever started yesterday. Abdomen is swollen and hard. No vomiting or diarrhea. Normal Bm today. Has the patient  traveled out of the country within the last 30 days? ---No Does the patient require triage? ---Yes Related visit to physician within the last 2 weeks? ---No Does the PT have any chronic conditions? (i.e. diabetes, asthma, etc.) ---No Guidelines Guideline Title Affirmed Question Affirmed Notes Nurse Date/Time Lamount Cohen Time) Abdominal Pain - Male [1] SEVERE pain (e.g., excruciating) AND [2] present > 1 hour pain level 7/10 Gwenyth Bender 02/15/2015 4:53:58 PM Disp. Time Lamount Cohen Time) Disposition Final User 02/15/2015 4:49:56 PM Send to Urgent Queue Nadene Rubins 02/15/2015 4:56:55 PM Go to ED Now Yes Loletta Specter, RN, Gracy Racer Understands: Yes Disagree/Comply: Comply PLEASE NOTE: All timestamps contained within this report are represented as [place] Standard Time. CONFIDENTIALTY NOTICE: This fax transmission is intended only for the addressee. It contains information that is legally privileged, confidential or otherwise protected from use or disclosure. If you are not the intended recipient, you are strictly prohibited from reviewing, disclosing, copying using or disseminating any of this information or taking any action in reliance on or regarding this information. If you have received this fax in error, please notify us immediately by telephone so that we can arrange for its return to [place]. Phone: [phone], Toll-Free: [phone], Fax: [phone] Page: 2 of 2 Call Id: [id number] Care Advice Given Per Guideline GO TO ED NOW: You need to be seen in the Emergency Department. Go to the ER at ___________ Hospital. Leave now. Drive carefully. DRIVING: Another adult should drive. NOTHING BY MOUTH: Do not eat or drink anything for now. (Reason: condition may need surgery and general anesthesia) BRING MEDICINES: * Please bring a list of your current medicines when you go to the Emergency Department (ER). * It is also a good idea to bring the pill bottles too. This will help the doctor to  make  certain you are taking the right medicines and the right dose. CARE ADVICE given per Abdominal Pain, Male (Adult) guideline. After Care Instructions Given Call Event Type User Date / Time Description Referrals [facility] - ED

## 2015-03-18 ENCOUNTER — Other Ambulatory Visit: Payer: Self-pay | Admitting: Internal Medicine

## 2015-03-20 LAB — LIPID PANEL
CHOLESTEROL: 268 mg/dL — AB (ref 0–200)
HDL: 45 mg/dL (ref 35–70)
LDL CALC: 189 mg/dL

## 2015-05-17 ENCOUNTER — Ambulatory Visit (INDEPENDENT_AMBULATORY_CARE_PROVIDER_SITE_OTHER): Payer: 59 | Admitting: Internal Medicine

## 2015-05-17 ENCOUNTER — Encounter: Payer: Self-pay | Admitting: Internal Medicine

## 2015-05-17 VITALS — BP 122/92 | HR 68 | Temp 97.7°F | Ht 68.75 in | Wt 218.0 lb

## 2015-05-17 DIAGNOSIS — I10 Essential (primary) hypertension: Secondary | ICD-10-CM

## 2015-05-17 DIAGNOSIS — E785 Hyperlipidemia, unspecified: Secondary | ICD-10-CM

## 2015-05-17 DIAGNOSIS — Z23 Encounter for immunization: Secondary | ICD-10-CM

## 2015-05-17 DIAGNOSIS — Z0000 Encounter for general adult medical examination without abnormal findings: Secondary | ICD-10-CM | POA: Diagnosis not present

## 2015-05-17 MED ORDER — ATORVASTATIN CALCIUM 40 MG PO TABS: 40.0000 mg | ORAL_TABLET | Freq: Every day | ORAL | Status: DC

## 2015-05-17 NOTE — Assessment & Plan Note (Signed)
Healthy Working on lifestyle Flu vaccine today

## 2015-05-17 NOTE — Addendum Note (Signed)
Addended by: Shon MilletWATLINGTON, Argelio Granier M on: 05/17/2015 08:08 AM   Modules accepted: Orders

## 2015-05-17 NOTE — Progress Notes (Signed)
Pre visit review using our clinic review tool, if applicable. No additional management support is needed unless otherwise documented below in the visit note. 

## 2015-05-17 NOTE — Assessment & Plan Note (Signed)
Has FH of early CAD on father's side (even uncle in 1630"s) Will start statin

## 2015-05-17 NOTE — Progress Notes (Signed)
Subjective:    Patient ID: Jerry Leach, male    DOB: 12-29-1980, 34 y.o.   MRN: [id number]  HPI Here for physical Reviewed ER visit-- diverticulitis Then saw GI- Dr Dulce Sellar Had changed his diet---will be getting colonoscopy  No other issues Same job No problems with BP med BP usually 130/80's  Current Outpatient Prescriptions on File Prior to Visit  Medication Sig Dispense Refill  . losartan-hydrochlorothiazide (HYZAAR) 100-25 MG per tablet TAKE 1 TABLET BY MOUTH DAILY. 90 tablet 3  . omega-3 acid ethyl esters (LOVAZA) 1 G capsule Take 1 g by mouth 2 (two) times daily.     No current facility-administered medications on file prior to visit.    No Known Allergies  Past Medical History  Diagnosis Date  . Hypertension   . Allergic rhinitis due to pollen     Past Surgical History  Procedure Laterality Date  . Knee arthroscopy Left 10/01    Family History  Problem Relation Age of Onset  . Hypertension Mother   . Heart disease Father   . Cancer Neg Hx   . Diabetes Neg Hx   . Heart disease Other   . Hypertension Other     Social History   Social History  . Marital Status: Married    Spouse Name: N/A  . Number of Children: 2  . Years of Education: N/A   Occupational History  . [employer]   . 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 Smoking status: Never Smoker   . Smokeless tobacco: Current User    Types: Snuff  . Alcohol Use: 0.0 oz/week    0 Standard drinks or equivalent per week     Comment: occ  . Drug Use: No  . Sexual Activity: Yes   Other Topics Concern  . Not on file   Social History Narrative   Review of Systems  Constitutional: Negative for fatigue and unexpected weight change.       Wears seat belt Has lost some weight   HENT: Negative for dental problem, hearing loss and tinnitus.        Regular with dentist  Eyes: Negative for visual disturbance.       No diplopia or unilateral vision  loss  Respiratory: Negative for cough, chest tightness and shortness of breath.   Cardiovascular: Negative for chest pain, palpitations and leg swelling.  Gastrointestinal: Negative for nausea, vomiting, abdominal pain, constipation and blood in stool.       No heartburn  Endocrine: Negative for polydipsia and polyuria.  Genitourinary: Negative for urgency and difficulty urinating.       No sexual problems  Musculoskeletal: Negative for joint swelling and arthralgias.       Some back stiffness---not a big deal  Skin: Negative for rash.       No suspicious lesions  Allergic/Immunologic: Positive for environmental allergies. Negative for immunocompromised state.       Allergies not bad yet  Neurological: Negative for dizziness, syncope, weakness, light-headedness and headaches.  Psychiatric/Behavioral: Negative for sleep disturbance and dysphoric mood. The patient is not nervous/anxious.        Objective:   Physical Exam  Constitutional: He is oriented to person, place, and time. He appears well-developed and well-nourished. No distress.  HENT:  Head: Normocephalic and atraumatic.  Right Ear: External ear normal.  Left Ear: External ear normal.  Mouth/Throat: Oropharynx is clear and moist. No oropharyngeal exudate.  Eyes: Conjunctivae and EOM are normal. Pupils are equal, round, and reactive to light.  Neck: Normal range of motion. Neck supple. No thyromegaly present.  Cardiovascular: Normal rate, regular rhythm, normal heart sounds and intact distal pulses.  Exam reveals no gallop.   No murmur heard. Pulmonary/Chest: Effort normal and breath sounds normal. No respiratory distress. He has no wheezes. He has no rales.  Abdominal: Soft. There is no tenderness.  Musculoskeletal: He exhibits no edema or tenderness.  Lymphadenopathy:    He has no cervical adenopathy.  Neurological: He is alert and oriented to person, place, and time.  Skin: No rash noted. No erythema.  Psychiatric: He  has a normal mood and affect. His behavior is normal.          Assessment & Plan:

## 2015-05-17 NOTE — Assessment & Plan Note (Signed)
BP Readings from Last 3 Encounters:  05/17/15 122/92  02/15/15 130/71  05/09/14 140/98   Usually fine when he checks No change in meds

## 2015-06-04 LAB — HM COLONOSCOPY

## 2015-06-21 ENCOUNTER — Encounter: Payer: Self-pay | Admitting: Internal Medicine

## 2015-07-01 ENCOUNTER — Other Ambulatory Visit: Payer: Self-pay

## 2015-07-09 ENCOUNTER — Other Ambulatory Visit: Payer: Self-pay

## 2015-07-10 ENCOUNTER — Other Ambulatory Visit (INDEPENDENT_AMBULATORY_CARE_PROVIDER_SITE_OTHER): Payer: 59

## 2015-07-10 DIAGNOSIS — E785 Hyperlipidemia, unspecified: Secondary | ICD-10-CM

## 2015-07-10 LAB — HEPATIC FUNCTION PANEL
ALK PHOS: 49 U/L (ref 39–117)
ALT: 45 U/L (ref 0–53)
AST: 25 U/L (ref 0–37)
Albumin: 4.4 g/dL (ref 3.5–5.2)
BILIRUBIN TOTAL: 0.6 mg/dL (ref 0.2–1.2)
Bilirubin, Direct: 0.1 mg/dL (ref 0.0–0.3)
Total Protein: 7 g/dL (ref 6.0–8.3)

## 2015-07-10 LAB — LIPID PANEL
CHOLESTEROL: 171 mg/dL (ref 0–200)
HDL: 53 mg/dL (ref >39.00)
LDL Cholesterol: 97 mg/dL (ref 0–99)
NonHDL: 118.05
Total CHOL/HDL Ratio: 3
Triglycerides: 106 mg/dL (ref 0.0–149.0)
VLDL: 21.2 mg/dL (ref 0.0–40.0)

## 2015-07-11 ENCOUNTER — Encounter: Payer: Self-pay | Admitting: *Deleted

## 2015-12-11 ENCOUNTER — Ambulatory Visit (INDEPENDENT_AMBULATORY_CARE_PROVIDER_SITE_OTHER): Payer: 59 | Admitting: Primary Care

## 2015-12-11 ENCOUNTER — Encounter: Payer: Self-pay | Admitting: Primary Care

## 2015-12-11 VITALS — BP 122/80 | HR 87 | Temp 98.2°F | Wt 225.4 lb

## 2015-12-11 DIAGNOSIS — R05 Cough: Secondary | ICD-10-CM | POA: Diagnosis not present

## 2015-12-11 DIAGNOSIS — R059 Cough, unspecified: Secondary | ICD-10-CM

## 2015-12-11 MED ORDER — BENZONATATE 200 MG PO CAPS: 200.0000 mg | ORAL_CAPSULE | Freq: Three times a day (TID) | ORAL | Status: DC | PRN

## 2015-12-11 NOTE — Progress Notes (Signed)
Subjective:    Patient ID: Jerry Leach, male    DOB: [birth year], 35 y.o.   MRN: [id number]  HPI  Jerry Leach more is a 35 year old male who presents today with a chief complaint of nasal congestion. He also reports chest congestion, productive cough with light green sputum, sinus pressure, sore throat.  Denies fevers, nausea. His symptoms began Friday last week (5 days ago). He's taken Alka-Selzer Cold and Sinus, Flonase, Mucinex with temporary improvement. 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  Review of Systems  Constitutional: Positive for chills. Negative for fever.  HENT: Positive for congestion, postnasal drip and sinus pressure.   Respiratory: Positive for cough. Negative for shortness of breath.   Cardiovascular: Negative for chest pain.       Past Medical History  Diagnosis Date  . Hypertension   . Allergic rhinitis due to pollen   . Hyperlipidemia      Social History   Social History  . Marital Status: Married    Spouse Name: N/A  . Number of Children: 2  . Years of Education: N/A   Occupational History  . [employer]     [place]   . 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 Smoking status: Never Smoker   . Smokeless tobacco: Current User    Types: Snuff  . Alcohol Use: 0.0 oz/week    0 Standard drinks or equivalent per week     Comment: occ  . Drug Use: No  . Sexual Activity: Yes   Other Topics Concern  . Not on file   Social History Narrative    Past Surgical History  Procedure Laterality Date  . Knee arthroscopy Left 10/01    Family History  Problem Relation Age of Onset  . Hypertension Mother   . Heart disease Father   . Cancer Neg Hx   . Diabetes Neg Hx   . Heart disease Other   . Hypertension Other     No Known Allergies  Current Outpatient Prescriptions on File Prior to Visit  Medication Sig Dispense Refill  . atorvastatin (LIPITOR) 40 MG tablet Take 1 tablet (40 mg  total) by mouth daily. 90 tablet 3  . losartan-hydrochlorothiazide (HYZAAR) 100-25 MG per tablet TAKE 1 TABLET BY MOUTH DAILY. 90 tablet 3  . omega-3 acid ethyl esters (LOVAZA) 1 G capsule Take 1 g by mouth 2 (two) times daily.     No current facility-administered medications on file prior to visit.    BP 122/80 mmHg  Pulse 87  Temp(Src) 98.2 F (36.8 C) (Oral)  Wt 225 lb 6.4 oz (102.241 kg)  SpO2 98%    Objective:   Physical Exam  Constitutional: He appears well-nourished.  HENT:  Right Ear: Tympanic membrane and ear canal normal.  Left Ear: Tympanic membrane and ear canal normal.  Nose: Mucosal edema present. Right sinus exhibits no maxillary sinus tenderness and no frontal sinus tenderness. Left sinus exhibits no maxillary sinus tenderness and no frontal sinus tenderness.  Mouth/Throat: Oropharynx is clear and moist.  Eyes: Conjunctivae are normal.  Neck: Neck supple.  Cardiovascular: Normal rate and regular rhythm.   Pulmonary/Chest: Effort normal and breath sounds normal. He has no wheezes. He has no rales.  Skin: Skin is warm and dry.          Assessment & Plan:  Allergic Rhinitis vs URI:  Allergy symptoms, now with what seems to be  viral for past 5 days. Exam today without evidence of bacterial involvement. Lungs clear, HENT with nasal mucosal edema, otherwise unremarkable. Vitals stable. Will have him resume Claritin. Continue Flonase.  RX for Tessalon pearls sent to pharmacy. Return precautions provided.

## 2015-12-11 NOTE — Patient Instructions (Signed)
Your symptoms are related to allergies that could have likely caused a viral infection. This viral infection will pass on its own and does not require antibiotics.  Continue Flonase, Mucinex. Start Claritin as discussed.  Ensure you are taking Mucinex with a full glass of water.  You may take Benzonatate capsules for cough. Take 1 capsule by mouth three times daily as needed for cough.  Please call me Monday if you develop persistent fevers of 101, continuously cough up green mucous, notice increased fatigue or weakness, or feel worse after 1 week of onset of symptoms.   Increase consumption of water intake and rest.  It was a pleasure meeting you!  Upper Respiratory Infection, Adult Most upper respiratory infections (URIs) are a viral infection of the air passages leading to the lungs. A URI affects the nose, throat, and upper air passages. The most common type of URI is nasopharyngitis and is typically referred to as "the common cold." URIs run their course and usually go away on their own. Most of the time, a URI does not require medical attention, but sometimes a bacterial infection in the upper airways can follow a viral infection. This is called a secondary infection. Sinus and middle ear infections are common types of secondary upper respiratory infections. Bacterial pneumonia can also complicate a URI. A URI can worsen asthma and chronic obstructive pulmonary disease (COPD). Sometimes, these complications can require emergency medical care and may be life threatening.  CAUSES Almost all URIs are caused by viruses. A virus is a type of germ and can spread from one person to another.  RISKS FACTORS You may be at risk for a URI if:   You smoke.   You have chronic heart or lung disease.  You have a weakened defense (immune) system.   You are very young or very old.   You have nasal allergies or asthma.  You work in crowded or poorly ventilated areas.  You work in health care  facilities or schools. SIGNS AND SYMPTOMS  Symptoms typically develop 2-3 days after you come in contact with a cold virus. Most viral URIs last 7-10 days. However, viral URIs from the influenza virus (flu virus) can last 14-18 days and are typically more severe. Symptoms may include:   Runny or stuffy (congested) nose.   Sneezing.   Cough.   Sore throat.   Headache.   Fatigue.   Fever.   Loss of appetite.   Pain in your forehead, behind your eyes, and over your cheekbones (sinus pain).  Muscle aches.  DIAGNOSIS  Your health care provider may diagnose a URI by:  Physical exam.  Tests to check that your symptoms are not due to another condition such as:  Strep throat.  Sinusitis.  Pneumonia.  Asthma. TREATMENT  A URI goes away on its own with time. It cannot be cured with medicines, but medicines may be prescribed or recommended to relieve symptoms. Medicines may help:  Reduce your fever.  Reduce your cough.  Relieve nasal congestion. HOME CARE INSTRUCTIONS   Take medicines only as directed by your health care provider.   Gargle warm saltwater or take cough drops to comfort your throat as directed by your health care provider.  Use a warm mist humidifier or inhale steam from a shower to increase air moisture. This may make it easier to breathe.  Drink enough fluid to keep your urine clear or pale yellow.   Eat soups and other clear broths and maintain good nutrition.  Rest as needed.   Return to work when your temperature has returned to normal or as your health care provider advises. You may need to stay home longer to avoid infecting others. You can also use a face mask and careful hand washing to prevent spread of the virus.  Increase the usage of your inhaler if you have asthma.   Do not use any tobacco products, including cigarettes, chewing tobacco, or electronic cigarettes. If you need help quitting, ask your health care  provider. PREVENTION  The best way to protect yourself from getting a cold is to practice good hygiene.   Avoid oral or hand contact with people with cold symptoms.   Wash your hands often if contact occurs.  There is no clear evidence that vitamin C, vitamin E, echinacea, or exercise reduces the chance of developing a cold. However, it is always recommended to get plenty of rest, exercise, and practice good nutrition.  SEEK MEDICAL CARE IF:   You are getting worse rather than better.   Your symptoms are not controlled by medicine.   You have chills.  You have worsening shortness of breath.  You have brown or red mucus.  You have yellow or brown nasal discharge.  You have pain in your face, especially when you bend forward.  You have a fever.  You have swollen neck glands.  You have pain while swallowing.  You have white areas in the back of your throat. SEEK IMMEDIATE MEDICAL CARE IF:   You have severe or persistent:  Headache.  Ear pain.  Sinus pain.  Chest pain.  You have chronic lung disease and any of the following:  Wheezing.  Prolonged cough.  Coughing up blood.  A change in your usual mucus.  You have a stiff neck.  You have changes in your:  Vision.  Hearing.  Thinking.  Mood. MAKE SURE YOU:   Understand these instructions.  Will watch your condition.  Will get help right away if you are not doing well or get worse.   This information is not intended to replace advice given to you by your health care provider. Make sure you discuss any questions you have with your health care provider.   Document Released: 12/30/2000 Document Revised: 11/20/2014 Document Reviewed: 10/11/2013 Elsevier Interactive Patient Education [employer]

## 2015-12-11 NOTE — Progress Notes (Signed)
Pre visit review using our clinic review tool, if applicable. No additional management support is needed unless otherwise documented below in the visit note. 

## 2015-12-18 ENCOUNTER — Telehealth: Payer: Self-pay

## 2015-12-18 ENCOUNTER — Telehealth: Payer: Self-pay | Admitting: *Deleted

## 2015-12-18 DIAGNOSIS — J019 Acute sinusitis, unspecified: Secondary | ICD-10-CM

## 2015-12-18 MED ORDER — AMOXICILLIN 500 MG PO TABS: 1000.0000 mg | ORAL_TABLET | Freq: Two times a day (BID) | ORAL | Status: DC

## 2015-12-18 MED ORDER — AZITHROMYCIN 250 MG PO TABS: ORAL_TABLET | ORAL | Status: DC

## 2015-12-18 NOTE — Telephone Encounter (Signed)
Pt left v/m; pt was seen 12/11/15; pt continues with sinus problem and when blows nose has yellow mucus,pt continues with cough and has been taking OTC med with no relief; pt request med sent to pharmacy. [facility]. Pt request cb.

## 2015-12-18 NOTE — Telephone Encounter (Signed)
Spoken and notified patient of Kate's comments. Patient verbalized understanding. 

## 2015-12-18 NOTE — Telephone Encounter (Signed)
Spoke to patient. He appreciated the antibiotic

## 2015-12-18 NOTE — Telephone Encounter (Signed)
Please check with him If worse sinus symptoms---cough, drainage, etc----I would consider adding an antibiotic (since it has been a week since he was seen)

## 2015-12-18 NOTE — Telephone Encounter (Signed)
Noted. Will send Zpak for continued symptoms despite conservative OTC treatment.  Please notify patient he is to take 2 tablets today, then 1 tablet daily for 4 additional days.

## 2015-12-18 NOTE — Addendum Note (Signed)
Addended by: Tillman AbideLETVAK, Jia Mohamed I on: 12/18/2015 10:22 AM   Modules accepted: Orders

## 2015-12-18 NOTE — Telephone Encounter (Signed)
Spoke to pt. He said his nasal drainage is now a darker yellow to green color. It was clear. He said his symptoms have not improved. [facility].

## 2015-12-18 NOTE — Telephone Encounter (Signed)
Let him know I sent a prescription for an antibiotic for him

## 2015-12-18 NOTE — Telephone Encounter (Signed)
PLEASE NOTE: All timestamps contained within this report are represented as [place] Time. CONFIDENTIALTY NOTICE: This fax transmission is intended only for the addressee. It contains information that is legally privileged, confidential or otherwise protected from use or disclosure. If you are not the intended recipient, you are strictly prohibited from reviewing, disclosing, copying using or disseminating any of this information or taking any action in reliance on or regarding this information. If you have received this fax in error, please notify us immediately by telephone so that we can arrange for its return to us. Phone: [phone], Toll-Free: [phone], Fax: [phone] Page: 1 of 2 Call Id: [id number] [place] [facility] - Client TELEPHONE ADVICE RECORD [facility] Patient Name: Kathlen BrunswickCHRISTOPHER SIZEMO RE Gender: Male DOB: [birth year] Age: [age] Y 3 M 16 D Return Phone Number: [phone] (Primary) Address: City/State/Zip: [place] Client [place] [facility] Site  [facility] - Night Physician Vernona Riegerlark, Katherine - NP Contact Type Call Who Is Calling Patient / Member / Family / Caregiver Call Type Triage / Clinical Relationship To Patient Self Return Phone Number [phone] (Primary) Chief Complaint Cough Reason for Call Symptomatic / Request for Health Information Initial Comment Caller states he was given a prescription for a cough and he is still having a cough with sinus drainage. PreDisposition Call Doctor Translation No Nurse Assessment Nurse: Valetta FullerSetzer, RN, Glendon AxeLuAnne Date/Time Lamount Cohen(Eastern Time): 12/17/2015 5:41:39 PM Confirm and document reason for call. If symptomatic, describe symptoms. You must click the next button to save text entered. ---Initial call; Caller states he was given a prescription for a cough and he is still having a cough with sinus drainage. Return call; above  verified - has had since last week / still going on / has rx for cough Has the patient traveled out of the country within the last 30 days? ---No Does the patient have any new or worsening symptoms? ---Yes Will a triage be completed? ---Yes Related visit to physician within the last 2 weeks? ---Yes Does the PT have any chronic conditions? (i.e. diabetes, asthma, etc.) ---Yes List chronic conditions. ---htn / high cholesterol Is this a behavioral health or substance abuse call? ---No Guidelines Guideline Title Affirmed Question Affirmed Notes Nurse Date/Time (Eastern Time) Cough - Acute Productive Cough present > 10 days Setzer, RN, Glendon AxeLuAnne 12/17/2015 5:46:30 PM Disp. Time Lamount Cohen(Eastern Time) Disposition Final User 12/17/2015 5:52:33 PM See PCP When Office is Open (within 3 days) Yes Setzer, RN, Glendon AxeLuAnne PLEASE NOTE: All timestamps contained within this report are represented as [place] Standard Time. CONFIDENTIALTY NOTICE: This fax transmission is intended only for the addressee. It contains information that is legally privileged, confidential or otherwise protected from use or disclosure. If you are not the intended recipient, you are strictly prohibited from reviewing, disclosing, copying using or disseminating any of this information or taking any action in reliance on or regarding this information. If you have received this fax in error, please notify us immediately by telephone so that we can arrange for its return to us. Phone: [phone], Toll-Free: [phone], Fax: [phone] Page: 2 of 2 Call Id: [id number] Caller Understands: Yes Disagree/Comply: Comply Care Advice Given Per Guideline SEE PCP WITHIN 3 DAYS: * You need to be seen within 2 or 3 days. Call your doctor during regular office hours and make an appointment. An urgent care center is often the best source of care if your doctor's office is closed or you can't get an  appointment. NOTE: If office will be open tomorrow, tell  caller to call then, not in 3 days. - HOME REMEDY - HARD CANDY: Hard candy works just as well as medicine-flavored OTC cough drops. Diabetics should use sugar-free candy. - HOME REMEDY - HONEY: This old home remedy has been shown to help decrease coughing at night. The adult dosage is 2 teaspoons (10 ml) at bedtime. Honey should not be given to infants under one year of age. HUMIDIFIER: If the air is dry, use a humidifier in the bedroom. (Reason: dry air makes coughs worse) AVOID TOBACCO SMOKE: Smoking or being exposed to smoke makes coughs much worse. CALL BACK IF: * Fever over 103 F (39.4 C) * Difficulty breathing occurs * You become worse. Comments User: Denver Faster, RN Date/Time Lamount Cohen Time): 12/17/2015 5:52:32 PM Advised to continue cough medicine already rx'ed until seen by pcp Referrals REFERRED TO PCP OFFICE

## 2016-04-01 ENCOUNTER — Other Ambulatory Visit: Payer: Self-pay | Admitting: Internal Medicine

## 2016-04-07 LAB — PULMONARY FUNCTION TEST

## 2016-04-22 ENCOUNTER — Encounter: Payer: Self-pay | Admitting: Internal Medicine

## 2016-04-22 ENCOUNTER — Telehealth: Payer: Self-pay

## 2016-04-22 ENCOUNTER — Ambulatory Visit (INDEPENDENT_AMBULATORY_CARE_PROVIDER_SITE_OTHER): Payer: 59 | Admitting: Internal Medicine

## 2016-04-22 VITALS — BP 126/88 | HR 80 | Temp 97.9°F | Wt 230.0 lb

## 2016-04-22 DIAGNOSIS — Z23 Encounter for immunization: Secondary | ICD-10-CM | POA: Diagnosis not present

## 2016-04-22 DIAGNOSIS — E785 Hyperlipidemia, unspecified: Secondary | ICD-10-CM

## 2016-04-22 DIAGNOSIS — I10 Essential (primary) hypertension: Secondary | ICD-10-CM | POA: Diagnosis not present

## 2016-04-22 MED ORDER — MOMETASONE FUROATE 50 MCG/ACT NA SUSP: 4.0000 | Freq: Every day | NASAL | 12 refills | Status: DC

## 2016-04-22 MED ORDER — LOSARTAN POTASSIUM-HCTZ 100-25 MG PO TABS: 1.0000 | ORAL_TABLET | Freq: Every day | ORAL | 3 refills | Status: DC

## 2016-04-22 MED ORDER — ATORVASTATIN CALCIUM 40 MG PO TABS: 40.0000 mg | ORAL_TABLET | Freq: Every day | ORAL | 3 refills | Status: DC

## 2016-04-22 NOTE — Progress Notes (Signed)
   Subjective:    Patient ID: Jerry Leach, male    DOB: [birth year], 35 y.o.   MRN: [id number]  HPI Here for follow up of HTN and high cholesterol  Doing well No concerns Same job Nothing new in FH  Doing well on statin No myalgias  Will occasionally get feeling of stomach "tightness" after eating  Works out regularly--cardio and weights  No chest pain No SOB No dizziness or syncope No edema  Current Outpatient Prescriptions on File Prior to Visit  Medication Sig Dispense Refill  . atorvastatin (LIPITOR) 40 MG tablet Take 1 tablet (40 mg total) by mouth daily. 90 tablet 3  . losartan-hydrochlorothiazide (HYZAAR) 100-25 MG tablet TAKE 1 TABLET BY MOUTH DAILY. 90 tablet 0   No current facility-administered medications on file prior to visit.     No Known Allergies  Past Medical History:  Diagnosis Date  . Allergic rhinitis due to pollen   . Hyperlipidemia   . Hypertension     Past Surgical History:  Procedure Laterality Date  . KNEE ARTHROSCOPY Left 10/01    Family History  Problem Relation Age of Onset  . Hypertension Mother   . Heart disease Father   . Cancer Neg Hx   . Diabetes Neg Hx   . Heart disease Other   . Hypertension Other     Social History   Social History  . Marital status: Married    Spouse name: N/A  . Number of children: 2  . Years of education: N/A   Occupational History  . 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 Smoking status: Never Smoker  . Smokeless tobacco: Current User    Types: Snuff  . Alcohol use 0.0 oz/week     Comment: occ  . Drug use: No  . Sexual activity: Yes   Other Topics Concern  . Not on file   Social History Narrative  . No narrative on file   Review of Systems  Appetite is good Sleeps great No skin problems Bowels are fine Allergies are acting up     Objective:   Physical Exam  Constitutional: He appears well-developed and  well-nourished. No distress.  Neck: Normal range of motion. Neck supple. No thyromegaly present.  Cardiovascular: Normal rate, regular rhythm, normal heart sounds and intact distal pulses.  Exam reveals no gallop.   No murmur heard. Pulmonary/Chest: Effort normal and breath sounds normal. No respiratory distress. He has no wheezes. He has no rales.  Abdominal: Soft. There is no tenderness.  Musculoskeletal: He exhibits no edema or tenderness.  Lymphadenopathy:    He has no cervical adenopathy.  Skin: No rash noted.  Psychiatric: He has a normal mood and affect. His behavior is normal.          Assessment & Plan:

## 2016-04-22 NOTE — Telephone Encounter (Signed)
Left message to call office for pt 

## 2016-04-22 NOTE — Telephone Encounter (Signed)
Okay to change to fluticasone but if he goes to [facility]--he can get it there for $25 for 5 month supply. If his insurance is cheaper, send 1 year Rx

## 2016-04-22 NOTE — Assessment & Plan Note (Signed)
Satisfied with primary prevention 

## 2016-04-22 NOTE — Addendum Note (Signed)
Addended by: Eual FinesBRIDGES, SHANNON P on: 04/22/2016 01:19 PM   Modules accepted: Orders

## 2016-04-22 NOTE — Telephone Encounter (Signed)
Pt's insurance will not cover Nasonex but the will cover fluticasone. Please make appropriate changes.

## 2016-04-22 NOTE — Progress Notes (Signed)
Pre visit review using our clinic review tool, if applicable. No additional management support is needed unless otherwise documented below in the visit note. 

## 2016-04-22 NOTE — Assessment & Plan Note (Signed)
BP Readings from Last 3 Encounters:  04/22/16 126/88  12/11/15 122/80  05/17/15 (!) 122/92   Good control No changes needed [facility]

## 2016-04-23 NOTE — Telephone Encounter (Signed)
Spoke to pt. He went ahead and bought a bottle OTC yesterday of fluticasone. He appreciated the heads up on getting it at a bulk store cheaper.

## 2016-11-30 IMAGING — CT CT ABD-PELV W/ CM
2 of 4 series · 16 of 46 positions shown, 18 images · IV contrast (Omni 300)
Comparison: None.

CLINICAL DATA: Right lower quadrant pain 3 days.

EXAM:
CT ABDOMEN AND PELVIS WITH CONTRAST
TECHNIQUE: Multidetector CT imaging of the abdomen and pelvis was performed
using the standard protocol following bolus administration of
intravenous contrast.
CONTRAST:  100mL OMNIPAQUE IOHEXOL 300 MG/ML  SOLN

[Series 2: abd/ pelvis 5.0 i30f 1 · axial · 0.84mm/px · z∈[+731,+1226]mm · 13 of 109 slices shown, 15 images]
[im 5/109  soft-tissue]
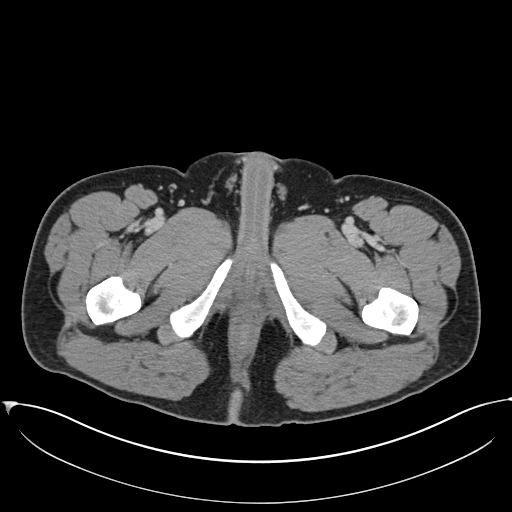
[im 5/109  bone]
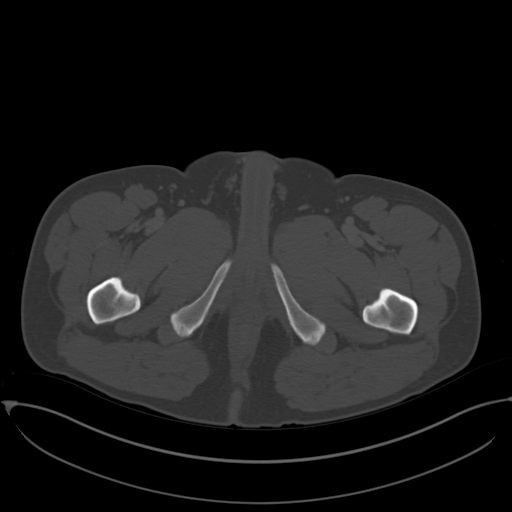
[im 14/109  soft-tissue]
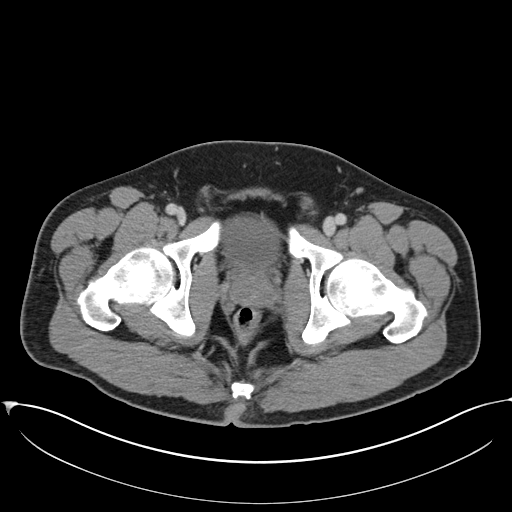
[im 23/109  soft-tissue]
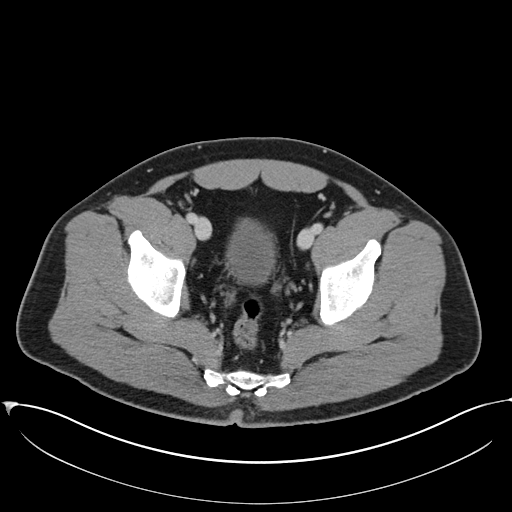
[im 32/109  soft-tissue]
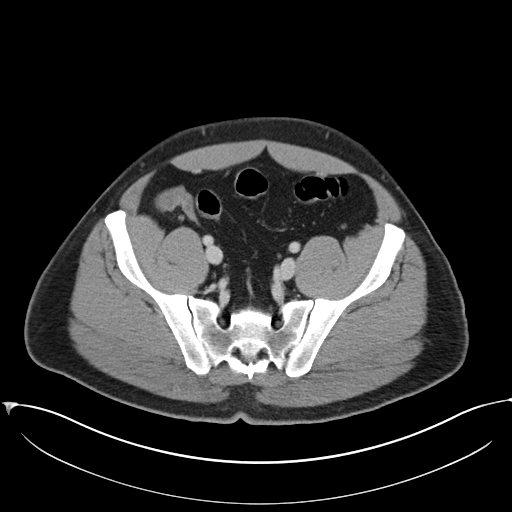
[im 37/109  soft-tissue]
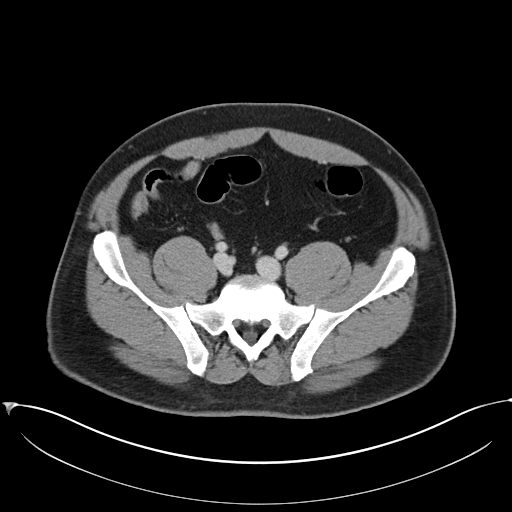
[im 46/109  soft-tissue]
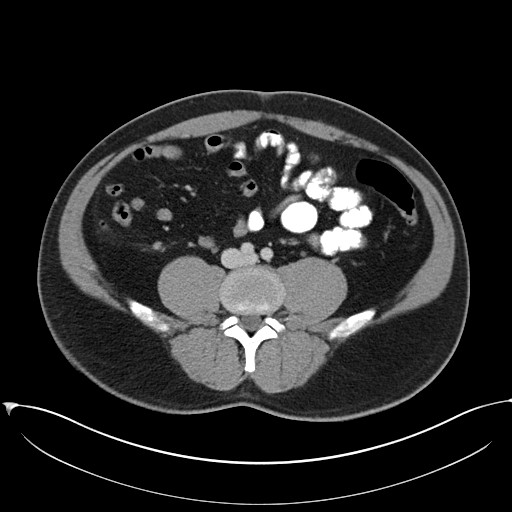
[im 55/109  soft-tissue]
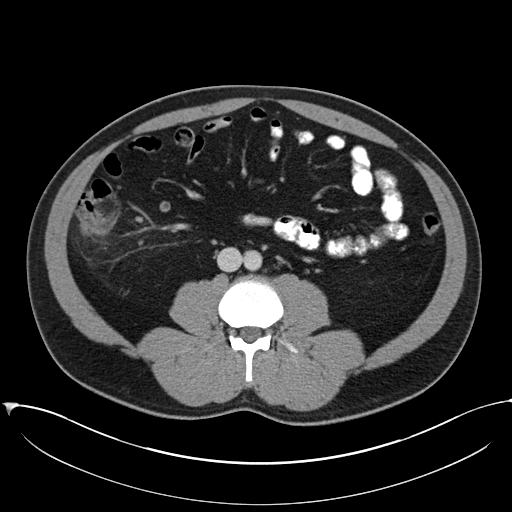
[im 64/109  soft-tissue]
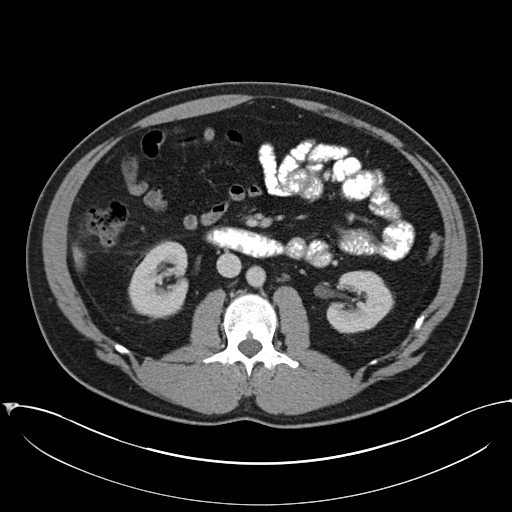
[im 73/109  soft-tissue]
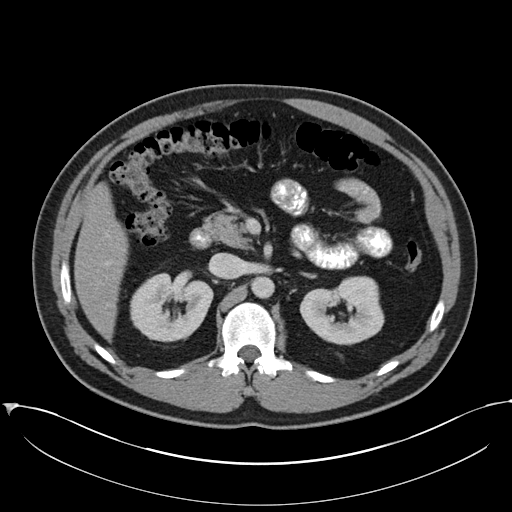
[im 73/109  bone]
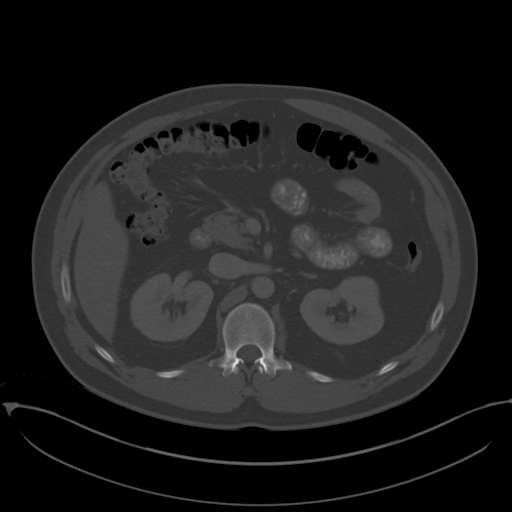
[im 77/109  soft-tissue]
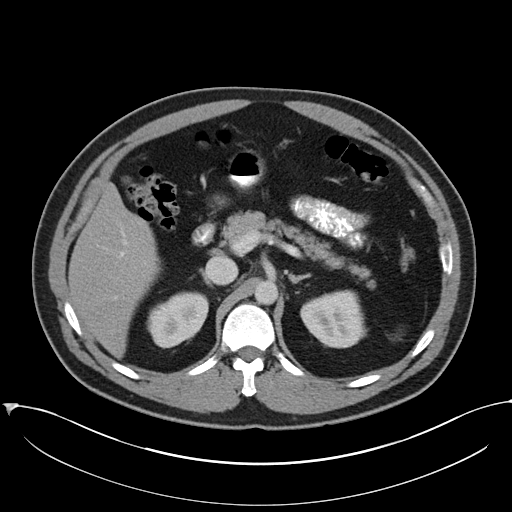
[im 86/109  soft-tissue]
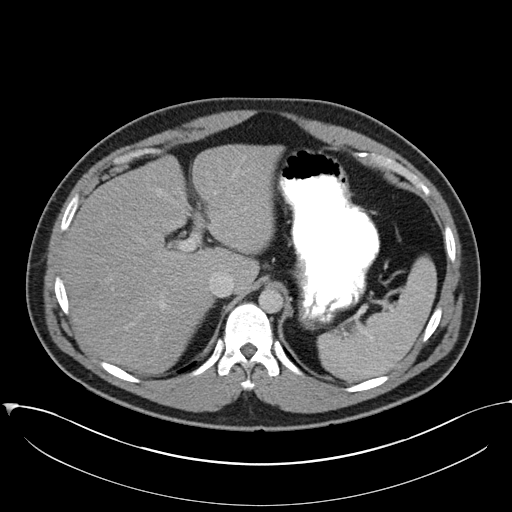
[im 95/109  soft-tissue]
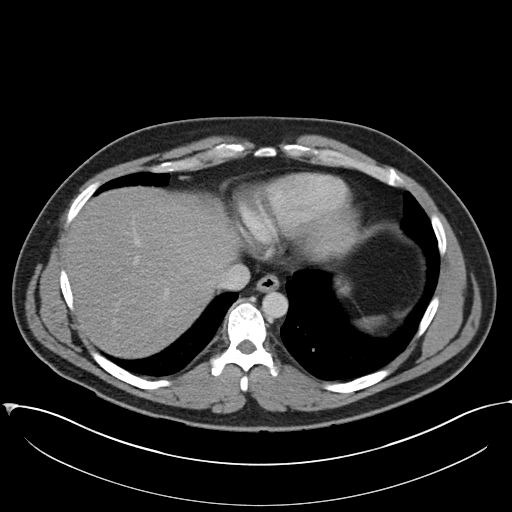
[im 104/109  soft-tissue]
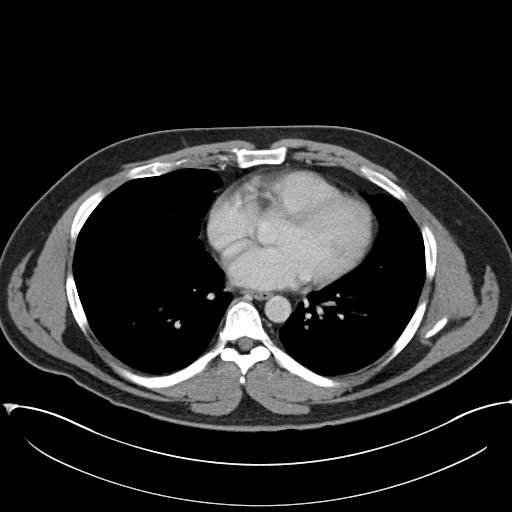

[Series 5: coronals · coronal · 0.78mm/px · 3 of 151 slices shown]
[im 51/151  soft-tissue]
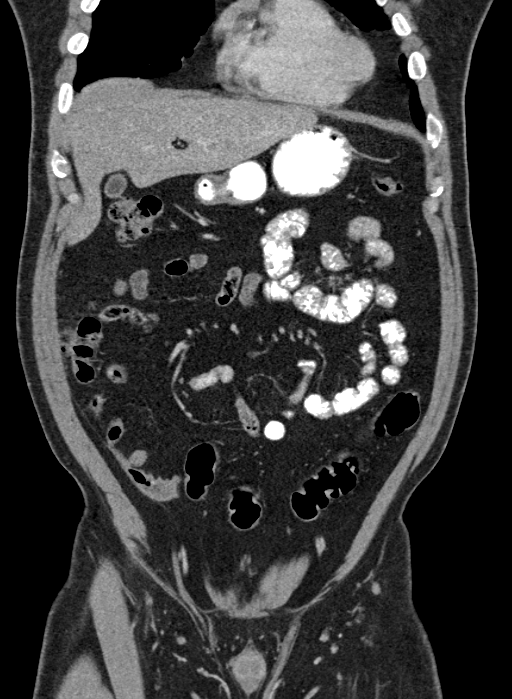
[im 67/151  soft-tissue]
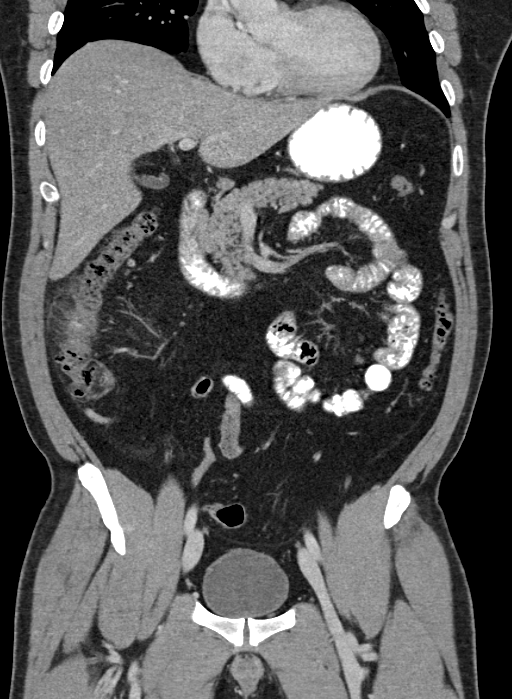
[im 84/151  soft-tissue]
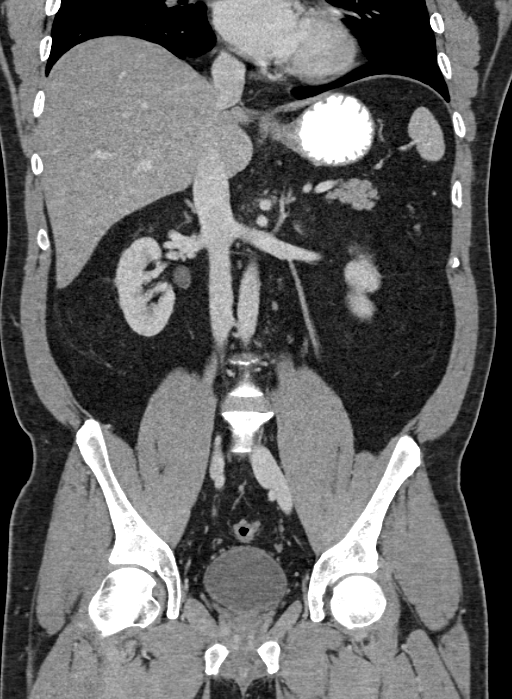

[16 of 46 positions shown; findings below may reference images not displayed]

FINDINGS: Lung bases are within normal.

Abdominal images demonstrate a normal liver, spleen, pancreas,
gallbladder and adrenal glands. Kidneys are normal in size without
hydronephrosis or nephrolithiasis. Ureters are normal. The appendix
is normal.

There is diverticulosis of the colon. There is an inflamed
diverticula over the ascending colon just above the ileocecal
junction with adjacent mesenteric inflammation and minimal fluid.
Findings compatible of mild acute diverticulitis. No evidence of
perforation or abscess.

Vascular structures are within normal.  Small bowel is normal.

Pelvic images demonstrate a normal bladder, prostate and rectum.
There is minimal degenerative change of the spine.
IMPRESSION: Evidence of mild acute diverticulitis of the ascending colon just
above the ileocecal junction. No evidence of perforation or abscess.
Normal appendix.

## 2017-03-09 LAB — PULMONARY FUNCTION TEST

## 2017-04-23 ENCOUNTER — Other Ambulatory Visit: Payer: Self-pay | Admitting: Internal Medicine

## 2017-04-30 ENCOUNTER — Encounter: Payer: Self-pay | Admitting: Internal Medicine

## 2017-04-30 ENCOUNTER — Ambulatory Visit (INDEPENDENT_AMBULATORY_CARE_PROVIDER_SITE_OTHER): Payer: 59 | Admitting: Internal Medicine

## 2017-04-30 VITALS — BP 130/88 | HR 84 | Temp 97.4°F | Ht 69.0 in | Wt 231.0 lb

## 2017-04-30 DIAGNOSIS — Z0000 Encounter for general adult medical examination without abnormal findings: Secondary | ICD-10-CM

## 2017-04-30 DIAGNOSIS — Z23 Encounter for immunization: Secondary | ICD-10-CM

## 2017-04-30 DIAGNOSIS — E785 Hyperlipidemia, unspecified: Secondary | ICD-10-CM | POA: Diagnosis not present

## 2017-04-30 DIAGNOSIS — I10 Essential (primary) hypertension: Secondary | ICD-10-CM | POA: Diagnosis not present

## 2017-04-30 MED ORDER — ATORVASTATIN CALCIUM 40 MG PO TABS: 40.0000 mg | ORAL_TABLET | Freq: Every day | ORAL | 3 refills | Status: DC

## 2017-04-30 MED ORDER — LOSARTAN POTASSIUM-HCTZ 100-25 MG PO TABS: 1.0000 | ORAL_TABLET | Freq: Every day | ORAL | 3 refills | Status: DC

## 2017-04-30 NOTE — Assessment & Plan Note (Signed)
No problems with statin 

## 2017-04-30 NOTE — Progress Notes (Signed)
Subjective:    Patient ID: Jerry Leach, male    DOB: [birth year], 36 y.o.   MRN: [id number]  HPI Here for physical Reviewed blood work and spirometry from [employer]  Still works out regularly No problems with statin and BP med Uses flonase prn--- takes OTC cetirizine daily  Current Outpatient Prescriptions on File Prior to Visit  Medication Sig Dispense Refill  . atorvastatin (LIPITOR) 40 MG tablet TAKE 1 TABLET BY MOUTH DAILY. 90 tablet 0  . fluticasone (FLONASE) 50 MCG/ACT nasal spray Place 2 sprays into both nostrils daily.    Marland Kitchen losartan-hydrochlorothiazide (HYZAAR) 100-25 MG tablet TAKE 1 TABLET BY MOUTH DAILY. 90 tablet 0   No current facility-administered medications on file prior to visit.     No Known Allergies  Past Medical History:  Diagnosis Date  . Allergic rhinitis due to pollen   . Hyperlipidemia   . Hypertension     Past Surgical History:  Procedure Laterality Date  . KNEE ARTHROSCOPY Left 10/01    Family History  Problem Relation Age of Onset  . Hypertension Mother   . Heart disease Father   . Lung cancer Father   . Heart disease Other   . Hypertension Other   . Cancer Neg Hx   . Diabetes Neg Hx     Social History   Social History  . Marital status: Married    Spouse name: N/A  . Number of children: 2  . Years of education: N/A   Occupational History  . [employer]   . 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 Smoking status: Never Smoker  . Smokeless tobacco: Current User    Types: Snuff  . Alcohol use 0.0 oz/week     Comment: occ  . Drug use: No  . Sexual activity: Yes   Other Topics Concern  . Not on file   Social History Narrative  . No narrative on file   Review of Systems  Constitutional: Negative for fatigue and unexpected weight change.       Wears seat belt  HENT: Negative for hearing loss, tinnitus and trouble swallowing.        Keeps up with dentist  Eyes:  Negative for visual disturbance.       No diplopia or unilateral vision loss  Respiratory: Negative for cough, chest tightness and shortness of breath.   Cardiovascular: Negative for chest pain, palpitations and leg swelling.  Gastrointestinal: Negative for blood in stool, constipation and nausea.       No heartburn  Endocrine: Negative for polydipsia and polyuria.  Genitourinary: Negative for difficulty urinating and urgency.  Musculoskeletal: Positive for back pain. Negative for arthralgias and joint swelling.  Skin: Negative for rash.  Allergic/Immunologic: Positive for environmental allergies. Negative for immunocompromised state.  Neurological: Negative for dizziness, syncope, light-headedness and headaches.  Hematological: Negative for adenopathy. Does not bruise/bleed easily.  Psychiatric/Behavioral: Negative for dysphoric mood and sleep disturbance. The patient is not nervous/anxious.        Objective:   Physical Exam  Constitutional: He is oriented to person, place, and time. He appears well-developed and well-nourished. No distress.  HENT:  Head: Normocephalic and atraumatic.  Right Ear: External ear normal.  Left Ear: External ear normal.  Mouth/Throat: Oropharynx is clear and moist. No oropharyngeal exudate.  Eyes: Pupils are equal, round, and reactive to light. Conjunctivae are normal.  Neck: Normal range of motion. No thyromegaly  present.  Cardiovascular: Normal rate, regular rhythm, normal heart sounds and intact distal pulses.  Exam reveals no gallop.   No murmur heard. Pulmonary/Chest: Effort normal and breath sounds normal. No respiratory distress. He has no wheezes. He has no rales.  Abdominal: He exhibits no distension. There is no tenderness. There is no rebound.  Musculoskeletal: He exhibits no edema or tenderness.  Lymphadenopathy:    He has no cervical adenopathy.  Neurological: He is alert and oriented to person, place, and time.  Skin: No rash noted. No  erythema.  Psychiatric: He has a normal mood and affect. His behavior is normal.          Assessment & Plan:

## 2017-04-30 NOTE — Assessment & Plan Note (Signed)
Healthy Only mildly overweight--BMI wrong for him Flu vaccine today

## 2017-04-30 NOTE — Assessment & Plan Note (Signed)
BP Readings from Last 3 Encounters:  04/30/17 130/88  04/22/16 126/88  12/11/15 122/80   Good control

## 2017-07-22 ENCOUNTER — Other Ambulatory Visit: Payer: Self-pay | Admitting: Internal Medicine

## 2018-05-13 ENCOUNTER — Ambulatory Visit (INDEPENDENT_AMBULATORY_CARE_PROVIDER_SITE_OTHER): Payer: 59 | Admitting: Internal Medicine

## 2018-05-13 ENCOUNTER — Encounter: Payer: Self-pay | Admitting: Internal Medicine

## 2018-05-13 VITALS — BP 116/80 | HR 78 | Temp 97.6°F | Ht 69.0 in | Wt 232.0 lb

## 2018-05-13 DIAGNOSIS — Z0000 Encounter for general adult medical examination without abnormal findings: Secondary | ICD-10-CM

## 2018-05-13 DIAGNOSIS — I10 Essential (primary) hypertension: Secondary | ICD-10-CM | POA: Diagnosis not present

## 2018-05-13 DIAGNOSIS — E785 Hyperlipidemia, unspecified: Secondary | ICD-10-CM

## 2018-05-13 DIAGNOSIS — Z23 Encounter for immunization: Secondary | ICD-10-CM

## 2018-05-13 MED ORDER — ATORVASTATIN CALCIUM 40 MG PO TABS: 40.0000 mg | ORAL_TABLET | Freq: Every day | ORAL | 3 refills | Status: DC

## 2018-05-13 MED ORDER — LOSARTAN POTASSIUM-HCTZ 100-25 MG PO TABS: 1.0000 | ORAL_TABLET | Freq: Every day | ORAL | 3 refills | Status: DC

## 2018-05-13 NOTE — Assessment & Plan Note (Signed)
Healthy Mild overweight ---discussed Flu vaccine today

## 2018-05-13 NOTE — Assessment & Plan Note (Signed)
No problems with statin for primary prevention 

## 2018-05-13 NOTE — Progress Notes (Signed)
Subjective:    Patient ID: Jerry Leach, male    DOB: 11/16/1980, 37 y.o.   MRN: [id number]  HPI Here for physical Doing well  Some back issues---gets tight Stretching usually helps Seems better with new mattress  Same job No problems with medication--changed BP med to night also  Current Outpatient Medications on File Prior to Visit  Medication Sig Dispense Refill  . atorvastatin (LIPITOR) 40 MG tablet Take 1 tablet (40 mg total) by mouth daily. 90 tablet 3  . cetirizine (ZYRTEC) 10 MG tablet Take 10 mg by mouth daily.    . fluticasone (FLONASE) 50 MCG/ACT nasal spray Place 2 sprays into both nostrils daily.    Marland Kitchen losartan-hydrochlorothiazide (HYZAAR) 100-25 MG tablet Take 1 tablet by mouth daily. 90 tablet 3   No current facility-administered medications on file prior to visit.     No Known Allergies  Past Medical History:  Diagnosis Date  . Allergic rhinitis due to pollen   . Hyperlipidemia   . Hypertension     Past Surgical History:  Procedure Laterality Date  . KNEE ARTHROSCOPY Left 10/01    Family History  Problem Relation Age of Onset  . Hypertension Mother   . Heart disease Father   . Lung cancer Father   . Heart disease Other   . Hypertension Other   . Cancer Neg Hx   . Diabetes Neg Hx     Social History   Socioeconomic History  . Marital status: Married    Spouse name: Not on file  . Number of children: 2  . Years of education: Not on file  . Highest education level: Not on file  Occupational History  . Occupation: IT sales professional    Comment: [place]   . 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 Financial resource strain: Not on file  . Food insecurity:    Worry: Not on file    Inability: Not on file  . Transportation needs:    Medical: Not on file    Non-medical: Not on file  Tobacco Use  . Smoking status: Never Smoker  . Smokeless tobacco: Current User    Types: Snuff  Substance and Sexual Activity  .  Alcohol use: Yes    Alcohol/week: 0.0 standard drinks    Comment: occ  . Drug use: No  . Sexual activity: Yes  Lifestyle  . Physical activity:    Days per week: Not on file    Minutes per session: Not on file  . Stress: Not on file  Relationships  . Social connections:    Talks on phone: Not on file    Gets together: Not on file    Attends religious service: Not on file    Active member of club or organization: Not on file    Attends meetings of clubs or organizations: Not on file    Relationship status: Not on file  . Intimate partner violence:    Fear of current or ex partner: Not on file    Emotionally abused: Not on file    Physically abused: Not on file    Forced sexual activity: Not on file  Other Topics Concern  . Not on file  Social History Narrative  . Not on file   Review of Systems  Constitutional: Negative for fatigue and unexpected weight change.       Wears seat belt  HENT: Negative for dental problem, hearing loss, tinnitus and trouble swallowing.  Keeps up with dentist  Eyes: Negative for visual disturbance.       No diplopia or unilateral vision loss  Respiratory: Negative for cough, chest tightness and shortness of breath.   Cardiovascular: Negative for chest pain, palpitations and leg swelling.  Gastrointestinal: Negative for abdominal pain, blood in stool and constipation.       No sig heartburn  Endocrine: Negative for polydipsia and polyuria.  Genitourinary: Negative for difficulty urinating and urgency.       No sexual problems  Musculoskeletal: Positive for back pain. Negative for arthralgias and joint swelling.  Skin: Negative for rash.       No suspicious lesions  Allergic/Immunologic: Positive for environmental allergies. Negative for immunocompromised state.  Neurological: Negative for dizziness, syncope, light-headedness and headaches.  Hematological: Negative for adenopathy. Does not bruise/bleed easily.  Psychiatric/Behavioral:  Negative for dysphoric mood. The patient is not nervous/anxious.        Some sleep problems---restless depending on shift Wife notes snoring but not apnea       Objective:   Physical Exam  Constitutional: He is oriented to person, place, and time. He appears well-developed. No distress.  HENT:  Head: Normocephalic and atraumatic.  Right Ear: External ear normal.  Left Ear: External ear normal.  Mouth/Throat: Oropharynx is clear and moist. No oropharyngeal exudate.  Eyes: Pupils are equal, round, and reactive to light. Conjunctivae are normal.  Neck: No thyromegaly present.  Cardiovascular: Normal rate, regular rhythm, normal heart sounds and intact distal pulses. Exam reveals no gallop.  No murmur heard. Respiratory: Effort normal and breath sounds normal. No respiratory distress. He has no wheezes. He has no rales.  GI: Soft. There is no tenderness.  Musculoskeletal: He exhibits no edema or tenderness.  Lymphadenopathy:    He has no cervical adenopathy.  Neurological: He is alert and oriented to person, place, and time.  Skin: No rash noted. No erythema.  Psychiatric: His behavior is normal.           Assessment & Plan:

## 2018-05-13 NOTE — Assessment & Plan Note (Signed)
BP Readings from Last 3 Encounters:  05/13/18 116/80  04/30/17 130/88  04/22/16 126/88   Good control He will give me copy of blood work he is due for next month

## 2018-10-20 ENCOUNTER — Other Ambulatory Visit: Payer: Self-pay

## 2018-10-20 ENCOUNTER — Ambulatory Visit (HOSPITAL_COMMUNITY): Payer: 59 | Admitting: Anesthesiology

## 2018-10-20 ENCOUNTER — Encounter (HOSPITAL_COMMUNITY): Admission: RE | Disposition: A | Discharge status: Home / Self Care | Payer: Self-pay | Attending: Gastroenterology

## 2018-10-20 ENCOUNTER — Encounter (HOSPITAL_COMMUNITY): Payer: Self-pay

## 2018-10-20 ENCOUNTER — Ambulatory Visit (HOSPITAL_COMMUNITY)
Admission: RE | Admit: 2018-10-20 | Discharge: 2018-10-20 | Disposition: A | Discharge status: Home / Self Care | Payer: 59 | Source: Other Acute Inpatient Hospital | Attending: Gastroenterology | Admitting: Gastroenterology

## 2018-10-20 DIAGNOSIS — K2210 Ulcer of esophagus without bleeding: Secondary | ICD-10-CM | POA: Diagnosis not present

## 2018-10-20 DIAGNOSIS — Z79899 Other long term (current) drug therapy: Secondary | ICD-10-CM | POA: Diagnosis not present

## 2018-10-20 DIAGNOSIS — E785 Hyperlipidemia, unspecified: Secondary | ICD-10-CM | POA: Diagnosis not present

## 2018-10-20 DIAGNOSIS — X58XXXA Exposure to other specified factors, initial encounter: Secondary | ICD-10-CM | POA: Insufficient documentation

## 2018-10-20 DIAGNOSIS — T18128A Food in esophagus causing other injury, initial encounter: Secondary | ICD-10-CM | POA: Insufficient documentation

## 2018-10-20 DIAGNOSIS — I10 Essential (primary) hypertension: Secondary | ICD-10-CM | POA: Diagnosis not present

## 2018-10-20 DIAGNOSIS — J301 Allergic rhinitis due to pollen: Secondary | ICD-10-CM | POA: Insufficient documentation

## 2018-10-20 HISTORY — PX: ESOPHAGOGASTRODUODENOSCOPY (EGD) WITH PROPOFOL: SHX5813

## 2018-10-20 HISTORY — PX: FOREIGN BODY REMOVAL: SHX962

## 2018-10-20 SURGERY — ESOPHAGOGASTRODUODENOSCOPY (EGD) WITH PROPOFOL
Anesthesia: General

## 2018-10-20 MED ORDER — FENTANYL CITRATE (PF) 100 MCG/2ML IJ SOLN
INTRAMUSCULAR | Status: AC
  Filled 2018-10-20: qty 4

## 2018-10-20 MED ORDER — LACTATED RINGERS IV SOLN
INTRAVENOUS | Status: DC | PRN
  Administered 2018-10-20 (×2): via INTRAVENOUS

## 2018-10-20 MED ORDER — LIDOCAINE 2% (20 MG/ML) 5 ML SYRINGE
INTRAMUSCULAR | Status: DC | PRN
  Administered 2018-10-20: 60 mg via INTRAVENOUS

## 2018-10-20 MED ORDER — SUGAMMADEX SODIUM 200 MG/2ML IV SOLN
INTRAVENOUS | Status: DC | PRN
  Administered 2018-10-20: 300 mg via INTRAVENOUS

## 2018-10-20 MED ORDER — PHENYLEPHRINE 40 MCG/ML (10ML) SYRINGE FOR IV PUSH (FOR BLOOD PRESSURE SUPPORT)
PREFILLED_SYRINGE | INTRAVENOUS | Status: DC | PRN
  Administered 2018-10-20 (×2): 80 ug via INTRAVENOUS

## 2018-10-20 MED ORDER — LACTATED RINGERS IV SOLN
INTRAVENOUS | Status: DC
  Administered 2018-10-20: 19:00:00 via INTRAVENOUS

## 2018-10-20 MED ORDER — ONDANSETRON HCL 4 MG/2ML IJ SOLN
INTRAMUSCULAR | Status: DC | PRN
  Administered 2018-10-20: 4 mg via INTRAVENOUS

## 2018-10-20 MED ORDER — PROPOFOL 10 MG/ML IV BOLUS
INTRAVENOUS | Status: DC | PRN
  Administered 2018-10-20: 200 mg via INTRAVENOUS
  Administered 2018-10-20: 150 mg via INTRAVENOUS

## 2018-10-20 MED ORDER — DEXAMETHASONE SODIUM PHOSPHATE 10 MG/ML IJ SOLN
INTRAMUSCULAR | Status: DC | PRN
  Administered 2018-10-20: 10 mg via INTRAVENOUS

## 2018-10-20 MED ORDER — FENTANYL CITRATE (PF) 250 MCG/5ML IJ SOLN
INTRAMUSCULAR | Status: DC | PRN
  Administered 2018-10-20: 50 ug via INTRAVENOUS

## 2018-10-20 MED ORDER — ROCURONIUM BROMIDE 10 MG/ML (PF) SYRINGE
PREFILLED_SYRINGE | INTRAVENOUS | Status: DC | PRN
  Administered 2018-10-20: 40 mg via INTRAVENOUS

## 2018-10-20 MED ORDER — SUCCINYLCHOLINE CHLORIDE 200 MG/10ML IV SOSY
PREFILLED_SYRINGE | INTRAVENOUS | Status: DC | PRN
  Administered 2018-10-20: 140 mg via INTRAVENOUS

## 2018-10-20 SURGICAL SUPPLY — 15 items

## 2018-10-20 NOTE — Op Note (Signed)
[name] Procedure Date : 10/20/2018 MRN: [id number] Attending MD: Vida Rigger , MD Date of Birth: 07-Apr-1981 CSN: [id number] Age: 38 Admit Type: Outpatient Procedure:                Upper GI endoscopy Indications:              Foreign body in the esophagus Providers:                Vida Rigger, MD, Clearnce Sorrel, RN, Vicki Mallet, RN,                            Arlee Muslim Tech., Technician Referring MD:              Medicines:                General Anesthesia Complications:            No immediate complications. Estimated Blood Loss:     Estimated blood loss: none. Procedure:                Pre-Anesthesia Assessment:                           - Prior to the procedure, a History and Physical                            was performed, and patient medications and                            allergies were reviewed. The patient's tolerance of                            previous anesthesia was also reviewed. The risks                            and benefits of the procedure and the sedation                            options and risks were discussed with the patient.                            All questions were answered, and informed consent                            was obtained. Prior Anticoagulants: The patient has                            taken no previous anticoagulant or antiplatelet                            agents. ASA Grade Assessment: I - A normal, healthy                            patient. After reviewing the risks and benefits,  the patient was deemed in satisfactory condition to                            undergo the procedure.                           After obtaining informed consent, the endoscope was                            passed under direct vision. Throughout the                            procedure, the patient's blood pressure, pulse, and                            oxygen  saturations were monitored continuously. The                            GIF-H190 ([id number] Olympus gastroscope was                            introduced through the mouth, and advanced to the                            second part of duodenum. The upper GI endoscopy was                            accomplished without difficulty. The patient                            tolerated the procedure well. Scope In: Scope Out: Findings:      Food was found in the lower third of the esophagus. Removal of food was       accomplished using the [employer]. And on reinserting the scope after       removal the remainder was easily washed into the stomach      The entire examined stomach was normal.      The duodenal bulb, first portion of the duodenum and second portion of       the duodenum were normal.      LA Grade A (one or more mucosal breaks less than 5 mm, not extending       between tops of 2 mucosal folds) esophagitis with no bleeding was found.      The exam was otherwise without abnormality. Impression:               - Food in the lower third of the esophagus. Removal                            was successful.                           - Normal stomach.                           - Normal duodenal bulb, first portion of the  duodenum and second portion of the duodenum.                           - LA Grade A erosive esophagitis.                           - The examination was otherwise normal. Moderate Sedation:      moderate sedation-none Recommendation:           - Patient has a contact number available for                            emergencies. The signs and symptoms of potential                            delayed complications were discussed with the                            patient. Return to normal activities tomorrow.                            Written discharge instructions were provided to the                            patient.                            - Patient has a contact number available for                            emergencies. The signs and symptoms of potential                            delayed complications were discussed with the                            patient. Return to normal activities tomorrow.                            Written discharge instructions were provided to the                            patient.                           - Clear liquid diet for 4 hours. Then may have soft                            solids                           - Continue present medications.                           - Return to GI clinic PRN.                           -  Telephone GI clinic if symptomatic PRN.                           - Use Prilosec OTC 20 mg PO daily for 2 weeks. Procedure Code(s):        --- Professional ---                           [phone], Esophagogastroduodenoscopy, flexible,                            transoral; with removal of foreign body(s) Diagnosis Code(s):        --- Professional ---                           G29.528U, Food in esophagus causing other injury,                            initial encounter                           K20.8, Other esophagitis                           T18.108A, Unspecified foreign body in esophagus                            causing other injury, initial encounter CPT copyright 2019 [employer] Association. All rights reserved. The codes documented in this report are preliminary and upon coder review may  be revised to meet current compliance requirements. Vida Rigger, MD 10/20/2018 7:37:16 PM This report has been signed electronically. Number of Addenda: 0

## 2018-10-20 NOTE — H&P (Signed)
Jerry Leach is an 38 y.o. male.   Chief Complaint: Food impaction HPI: Patient called our office earlier today and said after eating a pork chop sandwich he felt like it was lodged in his esophagus and he cannot get any water down and is continue to spit and hiccough has not had this problem before and is familiar to our office with a colonoscopy a few years ago but has not had any previous upper tract problems and he is a Company secretary on call and has not been out of the country and takes all the necessary precautions and has not been around any sick family members nor has he had a fever cough or runny nose and has no other complaints Past Medical History:  Diagnosis Date  . Allergic rhinitis due to pollen   . Hyperlipidemia   . Hypertension     Past Surgical History:  Procedure Laterality Date  . KNEE ARTHROSCOPY Left 10/01    Family History  Problem Relation Age of Onset  . Hypertension Mother   . Heart disease Father   . Lung cancer Father   . Heart disease Other   . Hypertension Other   . Cancer Neg Hx   . Diabetes Neg Hx    Social History:  reports that he has never smoked. His smokeless tobacco use includes snuff. He reports current alcohol use. He reports that he does not use drugs.  Allergies: No Known Allergies  Medications Prior to Admission  Medication Sig Dispense Refill  . atorvastatin (LIPITOR) 40 MG tablet Take 1 tablet (40 mg total) by mouth daily. 90 tablet 3  . cetirizine (ZYRTEC) 10 MG tablet Take 10 mg by mouth daily.    . fluticasone (FLONASE) 50 MCG/ACT nasal spray Place 2 sprays into both nostrils daily.    Marland Kitchen losartan-hydrochlorothiazide (HYZAAR) 100-25 MG tablet Take 1 tablet by mouth daily. 90 tablet 3    No results found for this or any previous visit (from the past 48 hour(s)). No results found.  ROS negative except above  Blood pressure (!) 170/98, pulse 98, temperature 98.6 F (37 C), temperature source Oral, resp. rate (!) 28, height 5'  9" (1.753 m), weight 104.3 kg, SpO2 97 %. Physical Exam vital signs stable afebrile no acute distress exam please see preassessment evaluation  Assessment/Plan Food impaction Plan: The risks benefits methods of endoscopy was discussed with the patient and will proceed this evening with anesthesia assistance with further work-up and plans pending those findings and we compared the endoscopy to the colonoscopy he previously had  [facility] E, MD 10/20/2018, 6:47 PM

## 2018-10-20 NOTE — Transfer of Care (Signed)
Immediate Anesthesia Transfer of Care Note  Patient: Jerry Leach  Procedure(s) Performed: ESOPHAGOGASTRODUODENOSCOPY (EGD) WITH PROPOFOL w/ Foreign body removal (N/A )  Patient Location: PACU  Anesthesia Type:General  Level of Consciousness: awake, alert  and oriented  Airway & Oxygen Therapy: Patient Spontanous Breathing and Patient connected to face mask oxygen  Post-op Assessment: Report given to RN and Post -op Vital signs reviewed and stable  Post vital signs: Reviewed and stable  Last Vitals:  Vitals Value Taken Time  BP 116/58 10/20/2018  7:45 PM  Temp    Pulse 108 10/20/2018  7:51 PM  Resp 21 10/20/2018  7:51 PM  SpO2 99 % 10/20/2018  7:51 PM  Vitals shown include unvalidated device data.  Last Pain:  Vitals:   10/20/18 1817  TempSrc: Oral  PainSc: 2          Complications: No apparent anesthesia complications

## 2018-10-20 NOTE — Anesthesia Preprocedure Evaluation (Signed)
Anesthesia Evaluation  Patient identified by MRN, date of birth, ID band Patient awake    Reviewed: Allergy & Precautions, NPO status , Patient's Chart, lab work & pertinent test results  Airway Mallampati: II  TM Distance: >3 FB     Dental   Pulmonary neg pulmonary ROS,    breath sounds clear to auscultation       Cardiovascular hypertension, Pt. on medications  Rhythm:Regular Rate:Normal     Neuro/Psych negative neurological ROS     GI/Hepatic negative GI ROS, Neg liver ROS,   Endo/Other  negative endocrine ROS  Renal/GU negative Renal ROS     Musculoskeletal   Abdominal   Peds  Hematology negative hematology ROS (+)   Anesthesia Other Findings   Reproductive/Obstetrics                             Lab Results  Component Value Date   WBC 8.3 02/15/2015   HGB 14.1 02/15/2015   HCT 40.9 02/15/2015   MCV 88.9 02/15/2015   PLT 262 02/15/2015   Lab Results  Component Value Date   CREATININE 0.95 02/15/2015   BUN 9 02/15/2015   NA 139 02/15/2015   K 3.6 02/15/2015   CL 104 02/15/2015   CO2 27 02/15/2015    Anesthesia Physical Anesthesia Plan  ASA: II and emergent  Anesthesia Plan: General   Post-op Pain Management:    Induction: Intravenous and Rapid sequence  PONV Risk Score and Plan: 2 and Ondansetron, Dexamethasone and Treatment may vary due to age or medical condition  Airway Management Planned: Oral ETT  Additional Equipment:   Intra-op Plan:   Post-operative Plan: Extubation in OR  Informed Consent: I have reviewed the patients History and Physical, chart, labs and discussed the procedure including the risks, benefits and alternatives for the proposed anesthesia with the patient or authorized representative who has indicated his/her understanding and acceptance.     Dental advisory given  Plan Discussed with: CRNA  Anesthesia Plan Comments:          Anesthesia Quick Evaluation

## 2018-10-20 NOTE — Anesthesia Procedure Notes (Signed)
Procedure Name: Intubation Date/Time: 10/20/2018 7:16 PM Performed by: Zollie Scale, CRNA Pre-anesthesia Checklist: Patient identified, Emergency Drugs available, Suction available and Patient being monitored Patient Re-evaluated:Patient Re-evaluated prior to induction Oxygen Delivery Method: Circle System Utilized Preoxygenation: Pre-oxygenation with 100% oxygen Induction Type: IV induction, Rapid sequence and Cricoid Pressure applied Laryngoscope Size: Glidescope and 4 Grade View: Grade I Tube type: Oral Tube size: 7.5 mm Number of attempts: 1 Airway Equipment and Method: Stylet and Oral airway Placement Confirmation: ETT inserted through vocal cords under direct vision,  positive ETCO2 and breath sounds checked- equal and bilateral Secured at: 22 cm Tube secured with: Tape Dental Injury: Teeth and Oropharynx as per pre-operative assessment  Comments: Utilized Glidescope d/t limited mouth opening, short TMD (2) and large tongue.

## 2018-10-20 NOTE — Anesthesia Postprocedure Evaluation (Signed)
Anesthesia Post Note  Patient: Jerry Leach  Procedure(s) Performed: ESOPHAGOGASTRODUODENOSCOPY (EGD) WITH PROPOFOL w/ Foreign body removal (N/A )     Patient location during evaluation: PACU Anesthesia Type: General Level of consciousness: awake and alert Pain management: pain level controlled Vital Signs Assessment: post-procedure vital signs reviewed and stable Respiratory status: spontaneous breathing, nonlabored ventilation, respiratory function stable and patient connected to nasal cannula oxygen Cardiovascular status: blood pressure returned to baseline and stable Postop Assessment: no apparent nausea or vomiting Anesthetic complications: no    Last Vitals:  Vitals:   10/20/18 1958 10/20/18 2000  BP: 110/60 (!) 101/44  Pulse: (!) 101 (!) 101  Resp: 20 (!) 26  Temp: 36.7 C   SpO2: 95% 96%    Last Pain:  Vitals:   10/20/18 1958  TempSrc:   PainSc: 0-No pain                 Kennieth Rad

## 2018-10-20 NOTE — Discharge Instructions (Signed)
Clear liquids only for 4 hours do not drive until tomorrow if doing well may have soft solids tomorrow or later tonight and remember to eat slowly take small bites chew your food well and drink plenty of liquids while eating and take for 2 weeks over-the-counter Prilosec once a day and call us if question or problem and follow-up if swallowing problems continue or other GI issues

## 2018-10-21 ENCOUNTER — Encounter (HOSPITAL_COMMUNITY): Payer: Self-pay | Admitting: Gastroenterology

## 2019-05-17 ENCOUNTER — Other Ambulatory Visit: Payer: Self-pay

## 2019-05-17 ENCOUNTER — Ambulatory Visit (INDEPENDENT_AMBULATORY_CARE_PROVIDER_SITE_OTHER): Payer: 59 | Admitting: Internal Medicine

## 2019-05-17 ENCOUNTER — Encounter: Payer: Self-pay | Admitting: Internal Medicine

## 2019-05-17 VITALS — BP 120/86 | HR 95 | Temp 97.7°F | Ht 68.5 in | Wt 232.0 lb

## 2019-05-17 DIAGNOSIS — E785 Hyperlipidemia, unspecified: Secondary | ICD-10-CM

## 2019-05-17 DIAGNOSIS — G479 Sleep disorder, unspecified: Secondary | ICD-10-CM

## 2019-05-17 DIAGNOSIS — Z0000 Encounter for general adult medical examination without abnormal findings: Secondary | ICD-10-CM | POA: Diagnosis not present

## 2019-05-17 DIAGNOSIS — I10 Essential (primary) hypertension: Secondary | ICD-10-CM

## 2019-05-17 DIAGNOSIS — R0683 Snoring: Secondary | ICD-10-CM | POA: Insufficient documentation

## 2019-05-17 DIAGNOSIS — G4733 Obstructive sleep apnea (adult) (pediatric): Secondary | ICD-10-CM | POA: Insufficient documentation

## 2019-05-17 NOTE — Assessment & Plan Note (Addendum)
Healthy Discussed fitness Had flu vaccine Too young for cancer screening Counseled about stopping the dip

## 2019-05-17 NOTE — Assessment & Plan Note (Signed)
Does fine with primary prevention

## 2019-05-17 NOTE — Assessment & Plan Note (Signed)
BP Readings from Last 3 Encounters:  05/17/19 120/86  10/20/18 (!) 101/44  05/13/18 116/80   Good control He will send me copy of his work labs

## 2019-05-17 NOTE — Progress Notes (Signed)
Subjective:    Patient ID: Jerry Leach, male    DOB: 09-25-1980, 38 y.o.   MRN: [id number]  HPI Here for physical  Had EGD in April----had pork chop that apparently didn't go all the way down Food removed and brief PPI No problems since then  Wife still concerned about snoring She also notes apnea--but only on back Awakens refreshed Not tired in day unless up at night (does 24 hour shifts)  Current Outpatient Medications on File Prior to Visit  Medication Sig Dispense Refill  . atorvastatin (LIPITOR) 40 MG tablet Take 1 tablet (40 mg total) by mouth daily. 90 tablet 3  . cetirizine (ZYRTEC) 10 MG tablet Take 10 mg by mouth daily.    . fluticasone (FLONASE) 50 MCG/ACT nasal spray Place 2 sprays into both nostrils daily.    Marland Kitchen losartan-hydrochlorothiazide (HYZAAR) 100-25 MG tablet Take 1 tablet by mouth daily. 90 tablet 3   No current facility-administered medications on file prior to visit.     No Known Allergies  Past Medical History:  Diagnosis Date  . Allergic rhinitis due to pollen   . Hyperlipidemia   . Hypertension     Past Surgical History:  Procedure Laterality Date  . ESOPHAGOGASTRODUODENOSCOPY (EGD) WITH PROPOFOL N/A 10/20/2018   Procedure: ESOPHAGOGASTRODUODENOSCOPY (EGD) WITH PROPOFOL;  Surgeon: Clarene Essex, MD;  Location: [place];  Service: Endoscopy;  Laterality: N/A;  . FOREIGN BODY REMOVAL  10/20/2018   Procedure: FOREIGN BODY REMOVAL;  Surgeon: Clarene Essex, MD;  Location: [facility];  Service: Endoscopy;;  . KNEE ARTHROSCOPY Left 10/01    Family History  Problem Relation Age of Onset  . Hypertension Mother   . Heart disease Father   . Lung cancer Father   . Heart disease Other   . Hypertension Other   . Cancer Neg Hx   . Diabetes Neg Hx     Social History   Socioeconomic History  . Marital status: Married    Spouse name: Not on file  . Number of children: 2  . Years of education: Not on file  . Highest education level: Not  on file  Occupational History  . Occupation: Airline pilot    Comment: [employer]   . 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 Financial resource strain: Not on file  . Food insecurity    Worry: Not on file    Inability: Not on file  . Transportation needs    Medical: Not on file    Non-medical: Not on file  Tobacco Use  . Smoking status: Never Smoker  . Smokeless tobacco: Current User    Types: Snuff  Substance and Sexual Activity  . Alcohol use: Yes    Alcohol/week: 0.0 standard drinks    Comment: occ  . Drug use: No  . Sexual activity: Yes  Lifestyle  . Physical activity    Days per week: Not on file    Minutes per session: Not on file  . Stress: Not on file  Relationships  . Social Herbalist on phone: Not on file    Gets together: Not on file    Attends religious service: Not on file    Active member of club or organization: Not on file    Attends meetings of clubs or organizations: Not on file    Relationship status: Not on file  . Intimate partner violence    Fear of current or ex partner: Not on file  Emotionally abused: Not on file    Physically abused: Not on file    Forced sexual activity: Not on file  Other Topics Concern  . Not on file  Social History Narrative  . Not on file   Review of Systems  Constitutional: Negative for fatigue and unexpected weight change.       Wears seat belt  HENT: Negative for dental problem, hearing loss, tinnitus and trouble swallowing.        Keeps up with dentist  Eyes: Negative for visual disturbance.       No diplopia or unilateral vision loss  Respiratory: Negative for cough, chest tightness and shortness of breath.   Cardiovascular: Negative for chest pain, palpitations and leg swelling.  Gastrointestinal: Negative for blood in stool and constipation.  Endocrine: Negative for polydipsia and polyuria.  Genitourinary: Negative for difficulty urinating, frequency and urgency.       No  sexual problems  Musculoskeletal: Negative for arthralgias and joint swelling.       Some low back pain---tries to stretch and exercise. Rare ibuprofen--does help  Skin: Negative for rash.       No suspicious lesions  Allergic/Immunologic: Positive for environmental allergies. Negative for immunocompromised state.       Quiet this year  Neurological: Negative for dizziness, syncope, light-headedness and headaches.  Hematological: Negative for adenopathy. Does not bruise/bleed easily.  Psychiatric/Behavioral: Negative for dysphoric mood and sleep disturbance. The patient is not nervous/anxious.        Objective:   Physical Exam  Constitutional: He is oriented to person, place, and time. He appears well-developed. No distress.  HENT:  Head: Normocephalic and atraumatic.  Right Ear: External ear normal.  Left Ear: External ear normal.  Mouth/Throat: Oropharynx is clear and moist. No oropharyngeal exudate.  Eyes: Pupils are equal, round, and reactive to light. Conjunctivae are normal.  Neck: No thyromegaly present.  Cardiovascular: Normal rate, regular rhythm, normal heart sounds and intact distal pulses. Exam reveals no gallop.  No murmur heard. Respiratory: Effort normal and breath sounds normal. No respiratory distress. He has no wheezes. He has no rales.  GI: Soft. There is no abdominal tenderness.  Musculoskeletal:        General: No tenderness or edema.  Lymphadenopathy:    He has no cervical adenopathy.  Neurological: He is alert and oriented to person, place, and time.  Skin: No rash noted. No erythema.  Psychiatric: He has a normal mood and affect. His behavior is normal.           Assessment & Plan:

## 2019-05-17 NOTE — Assessment & Plan Note (Signed)
Wife concerned about apnea but he doesn't have sig daytime somnolence Will set up for evaluation

## 2019-05-17 NOTE — Patient Instructions (Signed)
Please remember to send me a copy of your blood work from your job.

## 2019-05-19 ENCOUNTER — Encounter: Payer: Self-pay | Admitting: Internal Medicine

## 2019-06-01 ENCOUNTER — Encounter: Payer: Self-pay | Admitting: Pulmonary Disease

## 2019-06-01 ENCOUNTER — Ambulatory Visit: Payer: 59 | Admitting: Pulmonary Disease

## 2019-06-01 ENCOUNTER — Other Ambulatory Visit: Payer: Self-pay

## 2019-06-01 VITALS — BP 132/84 | HR 86 | Temp 97.2°F | Ht 68.5 in | Wt 236.4 lb

## 2019-06-01 DIAGNOSIS — R0683 Snoring: Secondary | ICD-10-CM | POA: Diagnosis not present

## 2019-06-01 NOTE — Progress Notes (Signed)
Subjective:    Patient ID: Jerry Leach, male    DOB: 03-01-1981, 38 y.o.   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  His wife of 16 years has noted loud snoring and witnessed that he stops breathing in his sleep.  He has tried nasal strips for snoring with some relief, Flonase does not seem to help as much.  He has tried a mouthguard but this made his teeth hurt.  Reports sleepiness score is 5 and he denies excessive daytime somnolence and fatigue.  He will occasionally take a nap after night shift but not otherwise.  Bedtime is between 10 PM and midnight depending on whether he is working or not, sleep latency is minimal, usually sleeps on his stomach with 1 pillow but does roll over on his back.  Snoring has been noted to be worse on his back.  He denies nocturnal awakenings unless he is at work and is out of bed by 7 AM feeling rested with occasional dryness of mouth and a headache. His weight has not changed much in the last 2 years There is no history suggestive of cataplexy, sleep paralysis or parasomnias  Hypertension is well controlled on a combination medication, he also has hyperlipidemia.  No family history of early CAD or OSA  He smokes an occasional cigar and uses snuff  Past Medical History:  Diagnosis Date  . Allergic rhinitis due to pollen   . Hyperlipidemia   . Hypertension    Past Surgical History:  Procedure Laterality Date  . ESOPHAGOGASTRODUODENOSCOPY (EGD) WITH PROPOFOL N/A 10/20/2018   Procedure: ESOPHAGOGASTRODUODENOSCOPY (EGD) WITH PROPOFOL;  Surgeon: Clarene Essex, MD;  Location: [place];  Service: Endoscopy;  Laterality: N/A;  . FOREIGN BODY REMOVAL  10/20/2018   Procedure: FOREIGN BODY REMOVAL;  Surgeon: Clarene Essex, MD;  Location: [place];  Service: Endoscopy;;  . KNEE ARTHROSCOPY Left 10/01   No Known Allergies  Social History   Socioeconomic History  . Marital status:  Married    Spouse name: Not on file  . Number of children: 2  . Years of education: Not on file  . Highest education level: Not on file  Occupational History  . Occupation: Airline pilot    Comment: [place]   . 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 Financial resource strain: Not on file  . Food insecurity    Worry: Not on file    Inability: Not on file  . Transportation needs    Medical: Not on file    Non-medical: Not on file  Tobacco Use  . Smoking status: Never Smoker  . Smokeless tobacco: Current User    Types: Snuff  Substance and Sexual Activity  . Alcohol use: Yes    Alcohol/week: 0.0 standard drinks    Comment: occ  . Drug use: No  . Sexual activity: Yes  Lifestyle  . Physical activity    Days per week: Not on file    Minutes per session: Not on file  . Stress: Not on file  Relationships  . Social Herbalist on phone: Not on file    Gets together: Not on file    Attends religious service: Not on file    Active member of club or organization: Not on file    Attends meetings of clubs or organizations: Not on file    Relationship status: Not on file  . Intimate partner violence  Fear of current or ex partner: Not on file    Emotionally abused: Not on file    Physically abused: Not on file    Forced sexual activity: Not on file  Other Topics Concern  . Not on file  Social History Narrative  . Not on file      Family History  Problem Relation Age of Onset  . Hypertension Mother   . Heart disease Father   . Lung cancer Father   . Heart disease Other   . Hypertension Other   . Cancer Neg Hx   . Diabetes Neg Hx      Review of Systems Constitutional: negative for anorexia, fevers and sweats  Eyes: negative for irritation, redness and visual disturbance  Ears, nose, mouth, throat, and face: negative for earaches, epistaxis and sore throat positive for nasal congestion Respiratory: negative for cough, dyspnea on  exertion, sputum and wheezing  Cardiovascular: negative for chest pain, dyspnea, lower extremity edema, orthopnea, palpitations and syncope  Gastrointestinal: negative for abdominal pain, constipation, diarrhea, melena, nausea and vomiting  Genitourinary:negative for dysuria, frequency and hematuria  Hematologic/lymphatic: negative for bleeding, easy bruising and lymphadenopathy  Musculoskeletal:negative for arthralgias, muscle weakness , pos for  stiff joints  Neurological: negative for coordination problems, gait problems, headaches and weakness  Endocrine: negative for diabetic symptoms including polydipsia, polyuria and weight loss     Objective:   Physical Exam  Gen. Pleasant, obese, in no distress, normal affect ENT - no pallor,icterus, no post nasal drip, class 2-3 airway Neck: No JVD, no thyromegaly, no carotid bruits Lungs: no use of accessory muscles, no dullness to percussion, decreased without rales or rhonchi  Cardiovascular: Rhythm regular, heart sounds  normal, no murmurs or gallops, no peripheral edema Abdomen: soft and non-tender, no hepatosplenomegaly, BS normal. Musculoskeletal: No deformities, no cyanosis or clubbing Neuro:  alert, non focal, no tremors       Assessment & Plan:

## 2019-06-01 NOTE — Assessment & Plan Note (Signed)
Given excessive daytime somnolence, narrow pharyngeal exam, witnessed apneas & loud snoring, obstructive sleep apnea is probable & an overnight polysomnogram will be scheduled as a home study. The pathophysiology of obstructive sleep apnea , it's cardiovascular consequences & modes of treatment including CPAP were discused with the patient in detail & they evidenced understanding.  He does not seem to be very sleepy -we will treat with CPAP if he has more than mild OSA, AHI more than 15 He has tried dental appliance and did not like the way it made him feel and caused dental pain

## 2019-06-01 NOTE — Patient Instructions (Signed)
Schedule home sleep study. We discussed treatment options 

## 2019-06-03 ENCOUNTER — Other Ambulatory Visit: Payer: Self-pay | Admitting: Internal Medicine

## 2019-07-27 ENCOUNTER — Ambulatory Visit: Payer: 59

## 2019-07-27 ENCOUNTER — Other Ambulatory Visit: Payer: Self-pay

## 2019-07-27 DIAGNOSIS — G4733 Obstructive sleep apnea (adult) (pediatric): Secondary | ICD-10-CM

## 2019-07-27 DIAGNOSIS — R0683 Snoring: Secondary | ICD-10-CM

## 2019-08-03 DIAGNOSIS — G4733 Obstructive sleep apnea (adult) (pediatric): Secondary | ICD-10-CM

## 2019-08-04 ENCOUNTER — Telehealth: Payer: Self-pay | Admitting: Pulmonary Disease

## 2019-08-04 DIAGNOSIS — G4733 Obstructive sleep apnea (adult) (pediatric): Secondary | ICD-10-CM

## 2019-08-04 NOTE — Telephone Encounter (Signed)
Called and spoke to patient. Advised of HST results/recs as stated by RA.  Patient voiced understanding. Order placed and office visit scheduled.

## 2019-08-04 NOTE — Telephone Encounter (Signed)
Date of Study: 07/27/2019  Per Dr. Vassie Loll Mild OSA 11 per hour increased when on back Rx recommend AutoCPAP 5-15cm OV in 6 weeks

## 2019-09-13 ENCOUNTER — Ambulatory Visit: Payer: 59 | Admitting: Pulmonary Disease

## 2019-09-13 ENCOUNTER — Encounter: Payer: Self-pay | Admitting: Pulmonary Disease

## 2019-09-13 ENCOUNTER — Other Ambulatory Visit: Payer: Self-pay

## 2019-09-13 DIAGNOSIS — G4733 Obstructive sleep apnea (adult) (pediatric): Secondary | ICD-10-CM

## 2019-09-13 DIAGNOSIS — I10 Essential (primary) hypertension: Secondary | ICD-10-CM | POA: Diagnosis not present

## 2019-09-13 NOTE — Assessment & Plan Note (Signed)
He has settled down well with auto CPAP.  He is having routine adjustment issues but overall is doing well, good compliance.  He has noted good improvement in his daytime somnolence and fatigue He has a right mask since occasionally he will sleep in the prone position Cardiovascular implications were discussed Care of CPAP machine was discussed and, and CPAP issues  Weight loss encouraged, compliance with goal of at least 4-6 hrs every night is the expectation. Advised against medications with sedative side effects Cautioned against driving when sleepy - understanding that sleepiness will vary on a day to day basis

## 2019-09-13 NOTE — Patient Instructions (Signed)
CPAP is working well and correcting apneas. Change to auto settings 5-12 centimeters  Call us for issues

## 2019-09-13 NOTE — Progress Notes (Signed)
   Subjective:    Patient ID: MARQUEZ CEESAY, male    DOB: [birth year], 39 y.o.   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  He denies dryness. He has noted improvement in his daytime somnolence and fatigue, wife is happy  Significant tests/ events reviewed   07/2019 HST mild OSA 11/h , worse on back 20/h, not in prone position  Review of Systems Patient denies significant dyspnea,cough, hemoptysis,  chest pain, palpitations, pedal edema, orthopnea, paroxysmal nocturnal dyspnea, lightheadedness, nausea, vomiting, abdominal or  leg pains      Objective:   Physical Exam  Gen. Pleasant, obese, in no distress ENT - no lesions, no post nasal drip Neck: No JVD, no thyromegaly, no carotid bruits Lungs: no use of accessory muscles, no dullness to percussion, decreased without rales or rhonchi  Cardiovascular: Rhythm regular, heart sounds  normal, no murmurs or gallops, no peripheral edema Musculoskeletal: No deformities, no cyanosis or clubbing , no tremors       Assessment & Plan:

## 2019-09-13 NOTE — Assessment & Plan Note (Signed)
Weight loss encouraged and benefits of CPAP for hypertension were discussed

## 2019-09-13 NOTE — Addendum Note (Signed)
Addended by: Benjie Karvonen R on: 09/13/2019 12:00 PM   Modules accepted: Orders

## 2020-03-12 ENCOUNTER — Ambulatory Visit: Payer: 59 | Admitting: Pulmonary Disease

## 2020-03-14 ENCOUNTER — Other Ambulatory Visit: Payer: Self-pay

## 2020-03-14 ENCOUNTER — Ambulatory Visit: Payer: 59 | Admitting: Pulmonary Disease

## 2020-03-14 ENCOUNTER — Encounter: Payer: Self-pay | Admitting: Pulmonary Disease

## 2020-03-14 DIAGNOSIS — G4733 Obstructive sleep apnea (adult) (pediatric): Secondary | ICD-10-CM | POA: Diagnosis not present

## 2020-03-14 NOTE — Progress Notes (Signed)
   Subjective:    Patient ID: Jerry Leach, male    DOB: [birth year], 39 y.o.   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  Tolerating pressure well, no mouth or nasal dryness.  He does not use the machine when he is working nights Download was reviewed which shows the pattern described above with 2 nights on and 1 night off. Compliance is good. No residual events, no leak  Significant tests/ events reviewed  07/2019 HST mild OSA 11/h , worse on back 20/h, not in prone position  Review of Systems Patient denies significant dyspnea,cough, hemoptysis,  chest pain, palpitations, pedal edema, orthopnea, paroxysmal nocturnal dyspnea, lightheadedness, nausea, vomiting, abdominal or  leg pains      Objective:   Physical Exam  Gen. Pleasant, muscular, in no distress ENT - short neck, no JVD Neck: No JVD, no thyromegaly, no carotid bruits Lungs: no use of accessory muscles, no dullness to percussion, decreased without rales or rhonchi  Cardiovascular: Rhythm regular, heart sounds  normal, no murmurs or gallops, no peripheral edema Musculoskeletal: No deformities, no cyanosis or clubbing , no tremors       Assessment & Plan:

## 2020-03-14 NOTE — Patient Instructions (Signed)
CPAP is working well on current auto settings 5 to 12 cm. CPAP supplies will be renewed for a year

## 2020-03-14 NOTE — Assessment & Plan Note (Signed)
Events are controlled on current settings, maximal 11 cm.  He prefers auto settings and will continue him on that.  CPAP supplies will be renewed for a year. We discussed care of the machine.  Weight loss encouraged, compliance with goal of at least 4-6 hrs every night is the expectation. Advised against medications with sedative side effects Cautioned against driving when sleepy - understanding that sleepiness will vary on a day to day basis

## 2020-05-22 ENCOUNTER — Other Ambulatory Visit: Payer: Self-pay

## 2020-05-22 ENCOUNTER — Ambulatory Visit (INDEPENDENT_AMBULATORY_CARE_PROVIDER_SITE_OTHER): Payer: 59 | Admitting: Internal Medicine

## 2020-05-22 ENCOUNTER — Encounter: Payer: Self-pay | Admitting: Internal Medicine

## 2020-05-22 VITALS — BP 118/78 | HR 74 | Temp 97.0°F | Ht 68.75 in | Wt 235.0 lb

## 2020-05-22 DIAGNOSIS — E785 Hyperlipidemia, unspecified: Secondary | ICD-10-CM | POA: Diagnosis not present

## 2020-05-22 DIAGNOSIS — I10 Essential (primary) hypertension: Secondary | ICD-10-CM

## 2020-05-22 DIAGNOSIS — Z23 Encounter for immunization: Secondary | ICD-10-CM | POA: Diagnosis not present

## 2020-05-22 DIAGNOSIS — G4733 Obstructive sleep apnea (adult) (pediatric): Secondary | ICD-10-CM

## 2020-05-22 DIAGNOSIS — Z0000 Encounter for general adult medical examination without abnormal findings: Secondary | ICD-10-CM

## 2020-05-22 DIAGNOSIS — J301 Allergic rhinitis due to pollen: Secondary | ICD-10-CM

## 2020-05-22 MED ORDER — LOSARTAN POTASSIUM 100 MG PO TABS: ORAL_TABLET | ORAL | 3 refills | Status: DC

## 2020-05-22 MED ORDER — ATORVASTATIN CALCIUM 40 MG PO TABS: 40.0000 mg | ORAL_TABLET | Freq: Every day | ORAL | 3 refills | Status: DC

## 2020-05-22 MED ORDER — HYDROCHLOROTHIAZIDE 25 MG PO TABS: ORAL_TABLET | ORAL | 3 refills | Status: DC

## 2020-05-22 NOTE — Assessment & Plan Note (Signed)
Healthy No cancer screening due to age Jerry Leach him to consider COVID vaccine and stop dipping Flu vaccine today

## 2020-05-22 NOTE — Assessment & Plan Note (Signed)
BP Readings from Last 3 Encounters:  05/22/20 118/78  03/14/20 130/70  09/13/19 130/84   Good control Labs at work were fine---I asked him to send me copy this year

## 2020-05-22 NOTE — Patient Instructions (Signed)
PLEASE send me a copy of your work lab work.

## 2020-05-22 NOTE — Assessment & Plan Note (Signed)
Discussed OTC regimen for better control

## 2020-05-22 NOTE — Assessment & Plan Note (Signed)
Doing well with primary prevention 

## 2020-05-22 NOTE — Progress Notes (Signed)
Subjective:    Patient ID: Jerry Leach, male    DOB: [birth year], 39 y.o.   MRN: [id number]  HPI Here for physical This visit occurred during the SARS-CoV-2 public health emergency.  Safety protocols were in place, including screening questions prior to the visit, additional usage of staff PPE, and extensive cleaning of exam room while observing appropriate contact time as indicated for disinfecting solutions.   Doing well No new concerns  Now using CPAP He doesn't notice much difference but his data look good  Won't get COVID vaccine---not willing Unwilling to stop dip either  Allergies acting up now Not regular with the flonase--- discussed  Current Outpatient Medications on File Prior to Visit  Medication Sig Dispense Refill  . atorvastatin (LIPITOR) 40 MG tablet TAKE 1 TABLET (40 MG TOTAL) BY MOUTH DAILY. 90 tablet 3  . cetirizine (ZYRTEC) 10 MG tablet Take 10 mg by mouth daily.    . fluticasone (FLONASE) 50 MCG/ACT nasal spray Place 2 sprays into both nostrils daily.    . hydrochlorothiazide (HYDRODIURIL) 25 MG tablet TAKE 1 TABLET BY MOUTH DAILY. TAKE WITH LOSARTAN 100 MG TABLET. 90 tablet 3  . losartan (COZAAR) 100 MG tablet TAKE 1 TABLET BY MOUTH DAILY. TAKE WITH HYDROCHLOROTHIAZIDE 25 MG TABLET. 90 tablet 3   No current facility-administered medications on file prior to visit.    Not on File  Past Medical History:  Diagnosis Date  . Allergic rhinitis due to pollen   . Hyperlipidemia   . Hypertension     Past Surgical History:  Procedure Laterality Date  . ESOPHAGOGASTRODUODENOSCOPY (EGD) WITH PROPOFOL N/A 10/20/2018   Procedure: ESOPHAGOGASTRODUODENOSCOPY (EGD) WITH PROPOFOL;  Surgeon: Vida Rigger, MD;  Location: [facility] ENDOSCOPY;  Service: Endoscopy;  Laterality: N/A;  . FOREIGN BODY REMOVAL  10/20/2018   Procedure: FOREIGN BODY REMOVAL;  Surgeon: Vida Rigger, MD;  Location[facility] END[facility]COPY;  Service: Endoscopy;;  . KNEE ARTHROSCOPY Left 10/01    Family  History  Problem Relation Age of Onset  . Hypertension Mother   . Heart disease Father   . Lung cancer Father   . Heart disease Other   . Hypertension Other   . Cancer Neg Hx   . Diabetes Neg Hx     Social History   Socioeconomic History  . Marital status: Married    Spouse name: Not on file  . Number of children: 2  . Years of education: Not on file  . Highest education level: Not on file  Occupational History  . Occupation: IT sales professional    Comment: [place]   . 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 Smoking status: Current Some Day Smoker    Types: Cigars  . Smokeless tobacco: Current User    Types: Snuff  . Tobacco comment: Occ cigar  Vaping Use  . Vaping Use: Never used  Substance and Sexual Activity  . Alcohol use: Yes    Alcohol/week: 0.0 standard drinks    Comment: occ  . Drug use: No  . Sexual activity: Yes  Other Topics Concern  . Not on file  Social History Narrative  . Not on file   Social Determinants of Health   Financial Resource Strain:   . Difficulty of Paying Living Expenses: Not on file  Food Insecurity:   . Worried About Programme researcher, broadcasting/film/video in the Last Year: Not on file  . Ran Out of Food in the Last Year: Not on file  Transportation Needs:   .  Lack of Transportation (Medical): Not on file  . Lack of Transportation (Non-Medical): Not on file  Physical Activity:   . Days of Exercise per Week: Not on file  . Minutes of Exercise per Session: Not on file  Stress:   . Feeling of Stress : Not on file  Social Connections:   . Frequency of Communication with Friends and Family: Not on file  . Frequency of Social Gatherings with Friends and Family: Not on file  . Attends Religious Services: Not on file  . Active Member of Clubs or Organizations: Not on file  . Attends Banker Meetings: Not on file  . Marital Status: Not on file  Intimate Partner Violence:   . Fear of Current or Ex-Partner: Not on file  .  Emotionally Abused: Not on file  . Physically Abused: Not on file  . Sexually Abused: Not on file   Review of Systems  Constitutional: Negative for fatigue and unexpected weight change.       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        Keeps up with dentist  Eyes: Negative for visual disturbance.       No diplopia or unilateral vision loss  Respiratory: Negative for chest tightness and shortness of breath.        Some cough from the allergies  Cardiovascular: Negative for chest pain, palpitations and leg swelling.  Gastrointestinal: Negative for blood in stool and constipation.       No heartburn  Endocrine: Negative for polydipsia and polyuria.  Genitourinary: Negative for difficulty urinating and urgency.       No sexual problems  Musculoskeletal: Negative for arthralgias and joint swelling.       Chronic back pain---uses ibuprofen for flares  Skin: Negative for rash.       Right axilla skin tag  Allergic/Immunologic: Positive for environmental allergies. Negative for immunocompromised state.  Neurological: Negative for dizziness, syncope, light-headedness and headaches.  Hematological: Negative for adenopathy. Does not bruise/bleed easily.  Psychiatric/Behavioral: Negative for dysphoric mood. The patient is not nervous/anxious.        Objective:   Physical Exam Constitutional:      Appearance: Normal appearance.  HENT:     Right Ear: Tympanic membrane, ear canal and external ear normal.     Left Ear: Tympanic membrane, ear canal and external ear normal.     Mouth/Throat:     Pharynx: No oropharyngeal exudate or posterior oropharyngeal erythema.  Eyes:     Conjunctiva/sclera: Conjunctivae normal.     Pupils: Pupils are equal, round, and reactive to light.  Cardiovascular:     Rate and Rhythm: Normal rate and regular rhythm.     Pulses: Normal pulses.     Heart sounds: No  murmur heard.  No gallop.   Pulmonary:     Effort: Pulmonary effort is normal.     Breath sounds: Normal breath sounds. No wheezing or rales.  Abdominal:     Palpations: Abdomen is soft.     Tenderness: There is no abdominal tenderness.  Musculoskeletal:     Cervical back: Neck supple.     Right lower leg: No edema.     Left lower leg: No edema.  Lymphadenopathy:     Cervical: No cervical adenopathy.  Skin:    General: Skin is warm.     Findings: No rash.  Neurological:  General: No focal deficit present.     Mental Status: He is alert and oriented to person, place, and time.  Psychiatric:        Mood and Affect: Mood normal.        Behavior: Behavior normal.            Assessment & Plan:

## 2020-05-22 NOTE — Assessment & Plan Note (Signed)
Doing well on CPAP 

## 2020-05-22 NOTE — Addendum Note (Signed)
Addended by: Eual Fines on: 05/22/2020 04:23 PM   Modules accepted: Orders

## 2020-05-31 ENCOUNTER — Ambulatory Visit: Payer: 59 | Admitting: Internal Medicine

## 2020-05-31 ENCOUNTER — Encounter: Payer: Self-pay | Admitting: Internal Medicine

## 2020-05-31 ENCOUNTER — Other Ambulatory Visit: Payer: Self-pay

## 2020-05-31 DIAGNOSIS — R739 Hyperglycemia, unspecified: Secondary | ICD-10-CM | POA: Insufficient documentation

## 2020-05-31 LAB — LIPID PANEL
Cholesterol: 172 mg/dL (ref 0–200)
HDL: 48.8 mg/dL (ref >39.00)
LDL Cholesterol: 87 mg/dL (ref 0–99)
NonHDL: 123.08
Total CHOL/HDL Ratio: 4
Triglycerides: 179 mg/dL — ABNORMAL HIGH (ref 0.0–149.0)
VLDL: 35.8 mg/dL (ref 0.0–40.0)

## 2020-05-31 LAB — HEMOGLOBIN A1C: Hgb A1c MFr Bld: 6 % (ref 4.6–6.5)

## 2020-05-31 LAB — GLUCOSE, RANDOM: Glucose, Bld: 92 mg/dL (ref 70–99)

## 2020-05-31 NOTE — Progress Notes (Signed)
Subjective:    Patient ID: Jerry Leach, male    DOB: [birth year], 39 y.o.   MRN: [id number]  HPI Here due to concerns about labs from work This visit occurred during the SARS-CoV-2 public health emergency.  Safety protocols were in place, including screening questions prior to the visit, additional usage of staff PPE, and extensive cleaning of exam room while observing appropriate contact time as indicated for disinfecting solutions.   Had fasted before 8AM labs But ate close to midnight (beans and burger) Triglycerides were 553 Sugar 116  He notes that he took a month off last month Didn't eat healthy---but now back to more normal  Current Outpatient Medications on File Prior to Visit  Medication Sig Dispense Refill  . atorvastatin (LIPITOR) 40 MG tablet Take 1 tablet (40 mg total) by mouth daily. 90 tablet 3  . cetirizine (ZYRTEC) 10 MG tablet Take 10 mg by mouth daily.    . fluticasone (FLONASE) 50 MCG/ACT nasal spray Place 2 sprays into both nostrils daily.    . hydrochlorothiazide (HYDRODIURIL) 25 MG tablet TAKE 1 TABLET BY MOUTH DAILY. TAKE WITH LOSARTAN 100 MG TABLET. 90 tablet 3  . losartan (COZAAR) 100 MG tablet TAKE 1 TABLET BY MOUTH DAILY. TAKE WITH HYDROCHLOROTHIAZIDE 25 MG TABLET. 90 tablet 3   No current facility-administered medications on file prior to visit.    Not on File  Past Medical History:  Diagnosis Date  . Allergic rhinitis due to pollen   . Hyperlipidemia   . Hypertension     Past Surgical History:  Procedure Laterality Date  . ESOPHAGOGASTRODUODENOSCOPY (EGD) WITH PROPOFOL N/A 10/20/2018   Procedure: ESOPHAGOGASTRODUODENOSCOPY (EGD) WITH PROPOFOL;  Surgeon: Vida Rigger, MD;  Location: [facility] ENDOSCOPY;  Service: Endoscopy;  Laterality: N/A;  . FOREIGN BODY REMOVAL  10/20/2018   Procedure: FOREIGN BODY REMOVAL;  Surgeon: Vida Rigger, MD;  Location: [facility] ENDOSCOPY;  Service: Endoscopy;;  . KNEE ARTHROSCOPY Left 10/01    Family History  Problem  Relation Age of Onset  . Hypertension Mother   . Heart disease Father   . Lung cancer Father   . Heart disease Other   . Hypertension Other   . Cancer Neg Hx   . Diabetes Neg Hx     Social History   Socioeconomic History  . Marital status: Married    Spouse name: Not on file  . Number of children: 2  . Years of education: Not on file  . Highest education level: Not on file  Occupational History  . Occupation: IT sales professional    Comment: [place]   . 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 Smoking status: Current Some Day Smoker    Types: Cigars  . Smokeless tobacco: Current User    Types: Snuff  . Tobacco comment: Occ cigar  Vaping Use  . Vaping Use: Never used  Substance and Sexual Activity  . Alcohol use: Yes    Alcohol/week: 0.0 standard drinks    Comment: occ  . Drug use: No  . Sexual activity: Yes  Other Topics Concern  . Not on file  Social History Narrative  . Not on file   Social Determinants of Health   Financial Resource Strain:   . Difficulty of Paying Living Expenses: Not on file  Food Insecurity:   . Worried About Programme researcher, broadcasting/film/video in the Last Year: Not on file  . Ran Out of Food in the Last Year: Not on file  Transportation Needs:   .  Lack of Transportation (Medical): Not on file  . Lack of Transportation (Non-Medical): Not on file  Physical Activity:   . Days of Exercise per Week: Not on file  . Minutes of Exercise per Session: Not on file  Stress:   . Feeling of Stress : Not on file  Social Connections:   . Frequency of Communication with Friends and Family: Not on file  . Frequency of Social Gatherings with Friends and Family: Not on file  . Attends Religious Services: Not on file  . Active Member of Clubs or Organizations: Not on file  . Attends Banker Meetings: Not on file  . Marital Status: Not on file  Intimate Partner Violence:   . Fear of Current or Ex-Partner: Not on file  . Emotionally Abused: Not  on file  . Physically Abused: Not on file  . Sexually Abused: Not on file   Review of Systems Sleeps great Now being more careful with eating    Objective:   Physical Exam Constitutional:      Appearance: Normal appearance.  Neurological:     Mental Status: He is alert.  Psychiatric:        Mood and Affect: Mood normal.        Behavior: Behavior normal.            Assessment & Plan:

## 2020-05-31 NOTE — Assessment & Plan Note (Signed)
Will recheck now as fasting overnight Had high triglycerides also Discussed metabolic syndrome---needs to lose belly fat and stop tobacco

## 2021-03-17 NOTE — Progress Notes (Signed)
@Patient  ID: , male    DOB: [birth year], 40 y.o.   MRN: [id number]  Chief Complaint  Patient presents with   Follow-up    OSA follow up per patient.     Referring provider: [id number], MD  HPI: 40 year old male, someday smoker. PMH significant for OSA, HTN, hyperlipidemia. Patient of Dr. 46, lasr seen 03/14/20.  03/18/2021 Patient presents today for annual follow-up OSA. He is doing well. No issues with mask fit or pressure setting. He works as a 03/20/2021, he wears his CPAP 61% of the time. DME company is [employer].   Airview download 12/18/20-03/17/21 59/90 days used; 61% > 4 hours Average usage days used 7 hours 36 mins Pressure 5-15cm h20 (10.2cm-95%) Airleaks 1.5L/min AHI 0.2    Significant tests/ events reviewed 07/2019 HST mild OSA 11/h , worse on back 20/h, not in prone position  No Known Allergies  Immunization History  Administered Date(s) Administered   Influenza,inj,Quad PF,6+ Mos 05/09/2014, 05/17/2015, 04/22/2016, 04/30/2017, 05/13/2018, 05/22/2020   Influenza-Unspecified 04/12/2019   Tdap 03/07/2014    Past Medical History:  Diagnosis Date   Allergic rhinitis due to pollen    Hyperlipidemia    Hypertension     Tobacco History: Social History   Tobacco Use  Smoking Status Some Days   Types: Cigars  Smokeless Tobacco Current   Types: Snuff  Tobacco Comments   Occ cigar   Ready to quit: Not Answered Counseling given: Not Answered Tobacco comments: Occ cigar   Outpatient Medications Prior to Visit  Medication Sig Dispense Refill   atorvastatin (LIPITOR) 40 MG tablet Take 1 tablet (40 mg total) by mouth daily. 90 tablet 3   cetirizine (ZYRTEC) 10 MG tablet Take 10 mg by mouth daily.     fluticasone (FLONASE) 50 MCG/ACT nasal spray Place 2 sprays into both nostrils daily.     hydrochlorothiazide (HYDRODIURIL) 25 MG tablet TAKE 1 TABLET BY MOUTH DAILY. TAKE WITH LOSARTAN 100 MG TABLET. 90 tablet 3   losartan (COZAAR)  100 MG tablet TAKE 1 TABLET BY MOUTH DAILY. TAKE WITH HYDROCHLOROTHIAZIDE 25 MG TABLET. 90 tablet 3   No facility-administered medications prior to visit.   Review of Systems  Review of Systems  Constitutional: Negative.  Negative for fatigue.  HENT: Negative.    Respiratory: Negative.    Psychiatric/Behavioral:  The patient is not nervous/anxious.     Physical Exam  BP 128/72 (BP Location: Left Arm, Patient Position: Sitting, Cuff Size: Large)   Pulse 98   Temp 98 F (36.7 C) (Oral)   Ht 5' 8.75" (1.746 m)   Wt 246 lb (111.6 kg)   SpO2 98%   BMI 36.59 kg/m  Physical Exam Constitutional:      Appearance: Normal appearance.  HENT:     Head: Normocephalic and atraumatic.     Mouth/Throat:     Mouth: Mucous membranes are moist.     Pharynx: Oropharynx is clear.     Comments: Deferred d/t masking Cardiovascular:     Rate and Rhythm: Normal rate and regular rhythm.  Pulmonary:     Effort: Pulmonary effort is normal.     Breath sounds: Normal breath sounds.     Comments: CTA Skin:    General: Skin is warm and dry.  Neurological:     General: No focal deficit present.     Mental Status: He is alert and oriented to person, place, and time. Mental status is at baseline.  Psychiatric:  Mood and Affect: Mood normal.        Behavior: Behavior normal.        Thought Content: Thought content normal.        Judgment: Judgment normal.     Lab Results:  CBC    Component Value Date/Time   WBC 8.3 02/15/2015 1805   RBC 4.60 02/15/2015 1805   HGB 14.1 02/15/2015 1805   HCT 40.9 02/15/2015 1805   PLT 262 02/15/2015 1805   MCV 88.9 02/15/2015 1805   MCH 30.7 02/15/2015 1805   MCHC 34.5 02/15/2015 1805   RDW 12.4 02/15/2015 1805    BMET    Component Value Date/Time   NA 139 02/15/2015 1805   K 3.6 02/15/2015 1805   CL 104 02/15/2015 1805   CO2 27 02/15/2015 1805   GLUCOSE 92 05/31/2020 0933   BUN 9 02/15/2015 1805   CREATININE 0.95 02/15/2015 1805   CALCIUM  9.3 02/15/2015 1805   GFRNONAA >60 02/15/2015 1805   GFRAA >60 02/15/2015 1805    BNP No results found for: BNP  ProBNP No results found for: PROBNP  Imaging: No results found.   Assessment & Plan:   OSA (obstructive sleep apnea) - HST in January 2021 showed mild OSA, AHI 11/hr - He is 61% compliant with CPAP and reports benefit from use (works Chief Technology Officer as Company secretary). No issues with mask fit, pressure setting or airleaks  - Pressure 5-15cm h20; residual AHI 0.2 - Renew CPAP supplies with [employer] - Encourage patient maintain healthy weight and advised against driving if experiencing excessive daytime sleepiness - FU in 1 year with Dr. Vassie Loll or APP    Glenford Bayley, NP 03/18/2021

## 2021-03-18 ENCOUNTER — Ambulatory Visit: Payer: 59 | Admitting: Pulmonary Disease

## 2021-03-18 ENCOUNTER — Encounter: Payer: Self-pay | Admitting: Primary Care

## 2021-03-18 ENCOUNTER — Other Ambulatory Visit: Payer: Self-pay

## 2021-03-18 ENCOUNTER — Ambulatory Visit: Payer: 59 | Admitting: Primary Care

## 2021-03-18 DIAGNOSIS — G4733 Obstructive sleep apnea (adult) (pediatric): Secondary | ICD-10-CM

## 2021-03-18 NOTE — Assessment & Plan Note (Addendum)
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 No issues with mask fit, pressure setting or airleaks  - Pressure 5-15cm h20; residual AHI 0.2 - Renew CPAP supplies with [employer] - Encourage patient maintain healthy weight and advised against driving if experiencing excessive daytime sleepiness - FU in 1 year with Dr. Vassie Loll or APP

## 2021-03-18 NOTE — Patient Instructions (Signed)
Recommendations: Aim to wear CPAP every night 4-6 hours or longer Do not drive if excessively tired/fatigued  Maintain healthy weight  Orders: Renew CPAP supplies with [employer]   Follow-up: 1 year with Dr. Vassie Loll or APP    CPAP and BPAP Information CPAP and BPAP (also called BiPAP) are methods that use air pressure to keep your airways open and to help you breathe well. CPAP and BPAP use different amounts of pressure. Your health care provider will tell you whether CPAP or BPAP would be more helpful for you. CPAP stands for "continuous positive airway pressure." With CPAP, the amount of pressure stays the same while you breathe in (inhale) and out (exhale). BPAP stands for "bi-level positive airway pressure." With BPAP, the amount of pressure will be higher when you inhale and lower when you exhale. This allows you to take larger breaths. CPAP or BPAP may be used in the hospital, or your health care provider may want you to use it at home. You may need to have a sleep study before your healthcare provider can order a machine for you to use at home. What are the advantages? CPAP or BPAP can be helpful if you have: Sleep apnea. Chronic obstructive pulmonary disease (COPD). Heart failure. Medical conditions that cause muscle weakness, including muscular dystrophy or amyotrophic lateral sclerosis (ALS). Other problems that cause breathing to be shallow, weak, abnormal, or difficult. CPAP and BPAP are most commonly used for obstructive sleep apnea (OSA) to keepthe airways from collapsing when the muscles relax during sleep. What are the risks? Generally, this is a safe treatment. However, problems may occur, including: Irritated skin or skin sores if the mask does not fit properly. Dry or stuffy nose or nosebleeds. Dry mouth. Feeling gassy or bloated. Sinus or lung infection if the equipment is not cleaned properly. When should CPAP or BPAP be used? In most cases, the mask only needs to be  worn during sleep. Generally, the mask needs to be worn throughout the night and during any daytime naps. People with certain medical conditions may also need to wear the mask at other times, such as when they are awake. Follow instructions from your health care providerabout when to use the machine. What happens during CPAP or BPAP?  Both CPAP and BPAP are provided by a small machine with a flexible plastic tube that attaches to a plastic mask that you wear. Air is blown through the mask into your nose or mouth. The amount of pressure that is used to blow the air can be adjusted on the machine. Your health care provider will set the pressuresetting and help you find the best mask for you. Tips for using the mask Because the mask needs to be snug, some people feel trapped or closed-in (claustrophobic) when first using the mask. If you feel this way, you may need to get used to the mask. One way to do this is to hold the mask loosely over your nose or mouth and then gradually apply the mask more snugly. You can also gradually increase the amount of time that you use the mask. Masks are available in various types and sizes. If your mask does not fit well, talk with your health care provider about getting a different one. Some common types of masks include: Full face masks, which fit over the mouth and nose. Nasal masks, which fit over the nose. Nasal pillow or prong masks, which fit into the nostrils. If you are using a mask that fits  over your nose and you tend to breathe through your mouth, a chin strap may be applied to help keep your mouth closed. Use a skin barrier to protect your skin as told by your health care provider. Some CPAP and BPAP machines have alarms that may sound if the mask comes off or develops a leak. If you have trouble with the mask, it is very important that you talk with your health care provider about finding a way to make the mask easier to tolerate. Do not stop using the mask.  There could be a negative impact on your health if you stop using the mask. Tips for using the machine Place your CPAP or BPAP machine on a secure table or stand near an electrical outlet. Know where the on/off switch is on the machine. Follow instructions from your health care provider about how to set the pressure on your machine and when you should use it. Do not eat or drink while the CPAP or BPAP machine is on. Food or fluids could get pushed into your lungs by the pressure of the CPAP or BPAP. For home use, CPAP and BPAP machines can be rented or purchased through home health care companies. Many different brands of machines are available. Renting a machine before purchasing may help you find out which particular machine works well for you. Your health insurance company may also decide which machine you may get. Keep the CPAP or BPAP machine and attachments clean. Ask your health care provider for specific instructions. Check the humidifier if you have a dry stuffy nose or nosebleeds. Make sure it is working correctly. Follow these instructions at home: Take over-the-counter and prescription medicines only as told by your health care provider. Ask if you can take sinus medicine if your sinuses are blocked. Do not use any products that contain nicotine or tobacco. These products include cigarettes, chewing tobacco, and vaping devices, such as e-cigarettes. If you need help quitting, ask your health care provider. Keep all follow-up visits. This is important. Contact a health care provider if: You have redness or pressure sores on your head, face, mouth, or nose from the mask or head gear. You have trouble using the CPAP or BPAP machine. You cannot tolerate wearing the CPAP or BPAP mask. Someone tells you that you snore even when wearing your CPAP or BPAP. Get help right away if: You have trouble breathing. You feel confused. Summary CPAP and BPAP are methods that use air pressure to keep  your airways open and to help you breathe well. If you have trouble with the mask, it is very important that you talk with your health care provider about finding a way to make the mask easier to tolerate. Do not stop using the mask. There could be a negative impact to your health if you stop using the mask. Follow instructions from your health care provider about when to use the machine. This information is not intended to replace advice given to you by your health care provider. Make sure you discuss any questions you have with your healthcare provider. Document Revised: 06/14/2020 Document Reviewed: 06/14/2020 Elsevier Patient Education  2022 [employer].

## 2021-05-27 ENCOUNTER — Other Ambulatory Visit: Payer: Self-pay

## 2021-05-27 ENCOUNTER — Telehealth: Payer: Self-pay | Admitting: *Deleted

## 2021-05-27 ENCOUNTER — Encounter: Payer: Self-pay | Admitting: Internal Medicine

## 2021-05-27 ENCOUNTER — Ambulatory Visit (INDEPENDENT_AMBULATORY_CARE_PROVIDER_SITE_OTHER): Payer: 59 | Admitting: Internal Medicine

## 2021-05-27 VITALS — BP 140/90 | HR 92 | Temp 97.6°F | Ht 68.5 in | Wt 242.0 lb

## 2021-05-27 DIAGNOSIS — I10 Essential (primary) hypertension: Secondary | ICD-10-CM | POA: Diagnosis not present

## 2021-05-27 DIAGNOSIS — G4733 Obstructive sleep apnea (adult) (pediatric): Secondary | ICD-10-CM

## 2021-05-27 DIAGNOSIS — Z0000 Encounter for general adult medical examination without abnormal findings: Secondary | ICD-10-CM

## 2021-05-27 DIAGNOSIS — E785 Hyperlipidemia, unspecified: Secondary | ICD-10-CM

## 2021-05-27 DIAGNOSIS — Z23 Encounter for immunization: Secondary | ICD-10-CM

## 2021-05-27 LAB — CBC
HCT: 44.3 % (ref 39.0–52.0)
Hemoglobin: 15 g/dL (ref 13.0–17.0)
MCHC: 33.9 g/dL (ref 30.0–36.0)
MCV: 89.4 fl (ref 78.0–100.0)
Platelets: 226 10*3/uL (ref 150.0–400.0)
RBC: 4.96 Mil/uL (ref 4.22–5.81)
RDW: 13.7 % (ref 11.5–15.5)
WBC: 7.2 10*3/uL (ref 4.0–10.5)

## 2021-05-27 LAB — COMPREHENSIVE METABOLIC PANEL
ALT: 27 U/L (ref 0–53)
AST: 18 U/L (ref 0–37)
Albumin: 4.7 g/dL (ref 3.5–5.2)
Alkaline Phosphatase: 55 U/L (ref 39–117)
BUN: 10 mg/dL (ref 6–23)
CO2: 26 mEq/L (ref 19–32)
Calcium: 9.8 mg/dL (ref 8.4–10.5)
Chloride: 102 mEq/L (ref 96–112)
Creatinine, Ser: 0.91 mg/dL (ref 0.40–1.50)
GFR: 104.52 mL/min (ref >60.00)
Glucose, Bld: 113 mg/dL — ABNORMAL HIGH (ref 70–99)
Potassium: 3.5 mEq/L (ref 3.5–5.1)
Sodium: 140 mEq/L (ref 135–145)
Total Bilirubin: 0.6 mg/dL (ref 0.2–1.2)
Total Protein: 7.3 g/dL (ref 6.0–8.3)

## 2021-05-27 LAB — LIPID PANEL
Cholesterol: 184 mg/dL (ref 0–200)
HDL: 48.4 mg/dL (ref >39.00)
NonHDL: 135.46
Total CHOL/HDL Ratio: 4
Triglycerides: 399 mg/dL — ABNORMAL HIGH (ref 0.0–149.0)
VLDL: 79.8 mg/dL — ABNORMAL HIGH (ref 0.0–40.0)

## 2021-05-27 LAB — LDL CHOLESTEROL, DIRECT: Direct LDL: 72 mg/dL

## 2021-05-27 MED ORDER — LOSARTAN POTASSIUM 100 MG PO TABS
ORAL_TABLET | ORAL | 3 refills | Status: DC
Start: 2021-05-27 — End: 2022-06-05

## 2021-05-27 MED ORDER — ATORVASTATIN CALCIUM 40 MG PO TABS
40.0000 mg | ORAL_TABLET | Freq: Every day | ORAL | 3 refills | Status: DC
Start: 2021-05-27 — End: 2022-06-05

## 2021-05-27 MED ORDER — HYDROCHLOROTHIAZIDE 25 MG PO TABS
ORAL_TABLET | ORAL | 3 refills | Status: DC
Start: 2021-05-27 — End: 2022-06-05

## 2021-05-27 NOTE — Progress Notes (Signed)
Subjective:    Patient ID: Jerry Leach, male    DOB: April 07, 1981, 40 y.o.   MRN: [id number]  HPI Here for physical This visit occurred during the SARS-CoV-2 public health emergency.  Safety protocols were in place, including screening questions prior to the visit, additional usage of staff PPE, and extensive cleaning of exam room while observing appropriate contact time as indicated for disinfecting solutions.   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 90 tablet 3   cetirizine (ZYRTEC) 10 MG tablet Take 10 mg by mouth daily.     fluticasone (FLONASE) 50 MCG/ACT nasal spray Place 2 sprays into both nostrils daily.     hydrochlorothiazide (HYDRODIURIL) 25 MG tablet TAKE 1 TABLET BY MOUTH DAILY. TAKE WITH LOSARTAN 100 MG TABLET. 90 tablet 3   losartan (COZAAR) 100 MG tablet TAKE 1 TABLET BY MOUTH DAILY. TAKE WITH HYDROCHLOROTHIAZIDE 25 MG TABLET. 90 tablet 3   No current facility-administered medications on file prior to visit.    No Known Allergies  Past Medical History:  Diagnosis Date   Allergic rhinitis due to pollen    Hyperlipidemia    Hypertension     Past Surgical History:  Procedure Laterality Date   ESOPHAGOGASTRODUODENOSCOPY (EGD) WITH PROPOFOL N/A 10/20/2018   Procedure: ESOPHAGOGASTRODUODENOSCOPY (EGD) WITH PROPOFOL;  Surgeon: Clarene Essex, MD;  Location: [place];  Service: Endoscopy;  Laterality: N/A;   FOREIGN BODY REMOVAL  10/20/2018   Procedure: FOREIGN BODY REMOVAL;  Surgeon: Clarene Essex, MD;  Location: [facility] ENDOSCOPY;  Service: Endoscopy;;   KNEE ARTHROSCOPY Left 10/01    Family History  Problem Relation Age of Onset   Hypertension Mother    Heart disease Father    Lung cancer Father    Heart disease Other     Hypertension Other    Cancer Neg Hx    Diabetes Neg Hx     Social History   Socioeconomic History   Marital status: Married    Spouse name: Not on file   Number of children: 2   Years of education: Not on file   Highest education level: Not on file  Occupational History   Occupation: Airline pilot    Comment: [place]    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   Occupation: Sawmill    Comment: own business  Tobacco Use   Smoking status: Some Days    Types: Cigars   Smokeless tobacco: Current    Types: Snuff   Tobacco comments:    Occ cigar  Vaping Use   Vaping Use: Never used  Substance and Sexual Activity   Alcohol use: Yes    Alcohol/week: 0.0 standard drinks    Comment: occ   Drug use: No   Sexual activity: Yes  Other Topics Concern   Not on file  Social History Narrative   Not on file   Social Determinants of Health   Financial Resource Strain: Not on file  Food Insecurity: Not on file  Transportation Needs: Not on file  Physical Activity: Not on file  Stress: Not on file  Social Connections: Not on file  Intimate Partner Violence: Not on file   Review of Systems  Constitutional:  Negative for fatigue and unexpected weight change.  Trying to eat healthy Wears seat belt  HENT:  Negative for dental problem, hearing loss and tinnitus.        Keeps up dentist  Eyes:  Negative for visual disturbance.       No diplopia or unilateral vision loss Had eye exam in May  Respiratory:  Negative for cough, chest tightness and shortness of breath.   Cardiovascular:  Negative for chest pain, palpitations and leg swelling.  Gastrointestinal:  Negative for blood in stool and constipation.       No heartburn  Endocrine: Negative for polydipsia and polyuria.  Genitourinary:  Negative for difficulty urinating.       No sexual problems  Musculoskeletal:  Positive for back pain. Negative for arthralgias and joint swelling.       Will use ibuprofen rarely for back   Skin:  Negative for rash.  Allergic/Immunologic: Positive for environmental allergies. Negative for immunocompromised state.       Satisfied with Rx  Neurological:  Negative for dizziness, syncope, light-headedness and headaches.  Hematological:  Negative for adenopathy. Does not bruise/bleed easily.  Psychiatric/Behavioral:  Negative for dysphoric mood and sleep disturbance. The patient is not nervous/anxious.        Sleeps with CPAP---this helps      Objective:   Physical Exam Constitutional:      Appearance: Normal appearance.  HENT:     Right Ear: Tympanic membrane and ear canal normal.     Left Ear: Tympanic membrane and ear canal normal.     Mouth/Throat:     Pharynx: No oropharyngeal exudate or posterior oropharyngeal erythema.  Eyes:     Conjunctiva/sclera: Conjunctivae normal.     Pupils: Pupils are equal, round, and reactive to light.  Cardiovascular:     Rate and Rhythm: Normal rate and regular rhythm.     Pulses: Normal pulses.     Heart sounds: No murmur heard.   No gallop.  Pulmonary:     Effort: Pulmonary effort is normal.     Breath sounds: Normal breath sounds. No wheezing or rales.  Abdominal:     Palpations: Abdomen is soft.     Tenderness: There is no abdominal tenderness.  Musculoskeletal:     Cervical back: Neck supple.     Right lower leg: No edema.     Left lower leg: No edema.  Lymphadenopathy:     Cervical: No cervical adenopathy.  Skin:    Findings: No lesion or rash.  Neurological:     General: No focal deficit present.     Mental Status: He is alert and oriented to person, place, and time.  Psychiatric:        Mood and Affect: Mood normal.        Behavior: Behavior normal.           Assessment & Plan:

## 2021-05-27 NOTE — Assessment & Plan Note (Signed)
Sleeps well with the CPAP 

## 2021-05-27 NOTE — Addendum Note (Signed)
Addended by: Eual Fines on: 05/27/2021 08:19 AM   Modules accepted: Orders

## 2021-05-27 NOTE — Telephone Encounter (Signed)
Patient came for his appointment.

## 2021-05-27 NOTE — Assessment & Plan Note (Signed)
Healthy Discussed weight/fitness Flu vaccine today Prefers no COVID vaccines Due again for colon next year

## 2021-05-27 NOTE — Assessment & Plan Note (Signed)
BP Readings from Last 3 Encounters:  05/27/21 140/90  03/18/21 128/72  05/31/20 122/70   Reasonable control on the losartan and HCTZ Due for labs

## 2021-05-27 NOTE — Assessment & Plan Note (Signed)
No problems with statin for primary prevention 

## 2021-05-27 NOTE — Telephone Encounter (Signed)
PLEASE NOTE: All timestamps contained within this report are represented as [place] Standard Time. CONFIDENTIALTY NOTICE: This fax transmission is intended only for the addressee. It contains information that is legally privileged, confidential or otherwise protected from use or disclosure. If you are not the intended recipient, you are strictly prohibited from reviewing, disclosing, copying using or disseminating any of this information or taking any action in reliance on or regarding this information. If you have received this fax in error, please notify us immediately by telephone so that we can arrange for its return to [place]. Phone: [phone], Toll-Free: [phone], Fax: [phone] Page: 1 of 1 Call Id: [id number] [facility] Night - Client Nonclinical Telephone Record  [facility] Client [facility] Night - Client Client Site [facility] - Night Physician Tillman Abide- MD Contact Type Call Who Is Calling Patient / Member / Family / Caregiver Caller Name Jerry Leach Caller Phone Number [phone] Call Type Message Only Information Provided Reason for Call Returning a Call from the Office Initial Comment Caller states he is returning missed call from office. He believes they were trying to confirm his appointment and he says he will be there. Additional Comment DOB [birth year]. Office hours provided to patient. Disp. Time Disposition Final User 05/26/2021 6:15:10 PM General Information Provided Yes Payton Emerald Call Closed By: Payton Emerald Transaction Date/Time: 05/26/2021 6:11:46 PM (ET)

## 2021-05-30 ENCOUNTER — Other Ambulatory Visit: Payer: Self-pay | Admitting: Internal Medicine

## 2022-06-03 ENCOUNTER — Encounter: Payer: 59 | Admitting: Internal Medicine

## 2022-06-05 ENCOUNTER — Encounter: Payer: Self-pay | Admitting: Internal Medicine

## 2022-06-05 ENCOUNTER — Ambulatory Visit (INDEPENDENT_AMBULATORY_CARE_PROVIDER_SITE_OTHER): Payer: 59 | Admitting: Internal Medicine

## 2022-06-05 VITALS — BP 130/88 | HR 76 | Temp 97.0°F | Ht 69.0 in | Wt 242.0 lb

## 2022-06-05 DIAGNOSIS — Z23 Encounter for immunization: Secondary | ICD-10-CM | POA: Diagnosis not present

## 2022-06-05 DIAGNOSIS — I10 Essential (primary) hypertension: Secondary | ICD-10-CM

## 2022-06-05 DIAGNOSIS — Z0000 Encounter for general adult medical examination without abnormal findings: Secondary | ICD-10-CM | POA: Diagnosis not present

## 2022-06-05 DIAGNOSIS — R739 Hyperglycemia, unspecified: Secondary | ICD-10-CM

## 2022-06-05 DIAGNOSIS — G4733 Obstructive sleep apnea (adult) (pediatric): Secondary | ICD-10-CM

## 2022-06-05 LAB — CBC
HCT: 46.1 % (ref 39.0–52.0)
Hemoglobin: 15.6 g/dL (ref 13.0–17.0)
MCHC: 33.9 g/dL (ref 30.0–36.0)
MCV: 90.2 fl (ref 78.0–100.0)
Platelets: 249 10*3/uL (ref 150.0–400.0)
RBC: 5.11 Mil/uL (ref 4.22–5.81)
RDW: 13.1 % (ref 11.5–15.5)
WBC: 7.1 10*3/uL (ref 4.0–10.5)

## 2022-06-05 LAB — COMPREHENSIVE METABOLIC PANEL
ALT: 27 U/L (ref 0–53)
AST: 21 U/L (ref 0–37)
Albumin: 4.8 g/dL (ref 3.5–5.2)
Alkaline Phosphatase: 56 U/L (ref 39–117)
BUN: 10 mg/dL (ref 6–23)
CO2: 31 mEq/L (ref 19–32)
Calcium: 9.6 mg/dL (ref 8.4–10.5)
Chloride: 98 mEq/L (ref 96–112)
Creatinine, Ser: 1 mg/dL (ref 0.40–1.50)
GFR: 92.67 mL/min (ref >60.00)
Glucose, Bld: 105 mg/dL — ABNORMAL HIGH (ref 70–99)
Potassium: 3.8 mEq/L (ref 3.5–5.1)
Sodium: 137 mEq/L (ref 135–145)
Total Bilirubin: 0.6 mg/dL (ref 0.2–1.2)
Total Protein: 7.3 g/dL (ref 6.0–8.3)

## 2022-06-05 LAB — LIPID PANEL
Cholesterol: 185 mg/dL (ref 0–200)
HDL: 46.3 mg/dL (ref >39.00)
NonHDL: 138.8
Total CHOL/HDL Ratio: 4
Triglycerides: 352 mg/dL — ABNORMAL HIGH (ref 0.0–149.0)
VLDL: 70.4 mg/dL — ABNORMAL HIGH (ref 0.0–40.0)

## 2022-06-05 LAB — HEMOGLOBIN A1C: Hgb A1c MFr Bld: 6.1 % (ref 4.6–6.5)

## 2022-06-05 LAB — LDL CHOLESTEROL, DIRECT: Direct LDL: 97 mg/dL

## 2022-06-05 MED ORDER — ATORVASTATIN CALCIUM 40 MG PO TABS: 40.0000 mg | ORAL_TABLET | Freq: Every day | ORAL | 3 refills | Status: DC

## 2022-06-05 MED ORDER — LOSARTAN POTASSIUM 100 MG PO TABS: ORAL_TABLET | ORAL | 3 refills | Status: DC

## 2022-06-05 MED ORDER — HYDROCHLOROTHIAZIDE 25 MG PO TABS: ORAL_TABLET | ORAL | 3 refills | Status: DC

## 2022-06-05 NOTE — Assessment & Plan Note (Signed)
Uses CPAP nightly 

## 2022-06-05 NOTE — Assessment & Plan Note (Signed)
Discussed carb restriction Will check labs

## 2022-06-05 NOTE — Assessment & Plan Note (Signed)
Healthy Due for colon---he will call GI Flu vaccine today Works on fitness Prefers no new COVID vaccine--discussed

## 2022-06-05 NOTE — Assessment & Plan Note (Signed)
BP Readings from Last 3 Encounters:  06/05/22 130/88  05/27/21 140/90  03/18/21 128/72   Good control on losartan 100, HCTZ 25 Will check labs

## 2022-06-05 NOTE — Patient Instructions (Signed)
Please set up your colonoscopy with the GI doctor.

## 2022-06-05 NOTE — Progress Notes (Signed)
Subjective:    Patient ID: Jerry Leach, male    DOB: 11-03-1980, 41 y.o.   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 90 tablet 3   celecoxib (CELEBREX) 200 MG capsule Take 200 mg by mouth 2 (two) times daily.     cetirizine (ZYRTEC) 10 MG tablet Take 10 mg by mouth daily.     fluticasone (FLONASE) 50 MCG/ACT nasal spray Place 2 sprays into both nostrils daily.     hydrochlorothiazide (HYDRODIURIL) 25 MG tablet TAKE 1 TABLET BY MOUTH DAILY. TAKE WITH LOSARTAN 100 MG TABLET. 90 tablet 3   losartan (COZAAR) 100 MG tablet TAKE 1 TABLET BY MOUTH DAILY. TAKE WITH HYDROCHLOROTHIAZIDE 25 MG TABLET. 90 tablet 3   No current facility-administered medications on file prior to visit.    No Known Allergies  Past Medical History:  Diagnosis Date   Allergic rhinitis due to pollen    Hyperlipidemia    Hypertension     Past Surgical History:  Procedure Laterality Date   ESOPHAGOGASTRODUODENOSCOPY (EGD) WITH PROPOFOL N/A 10/20/2018   Procedure: ESOPHAGOGASTRODUODENOSCOPY (EGD) WITH PROPOFOL;  Surgeon: Vida Rigger, MD;  Location: [facility] ENDOSCOPY;  Service: Endoscopy;  Laterality: N/A;   FOREIGN BODY REMOVAL  10/20/2018   Procedure: FOREIGN BODY REMOVAL;  Surgeon: Vida Rigger, MD;  Location: [facility] ENDOSCOPY;  Service: Endoscopy;;   KNEE ARTHROSCOPY Left 10/01    Family History  Problem Relation Age of Onset   Hypertension Mother    Heart disease Father    Lung cancer Father    Heart disease Other    Hypertension Other    Cancer Neg Hx    Diabetes Neg Hx     Social History   Socioeconomic  History   Marital status: Married    Spouse name: Not on file   Number of children: 2   Years of education: Not on file   Highest education level: Not on file  Occupational History   Occupation: IT sales professional    Comment: [employer]    Occupation: Engineer, structural for county also   Occupation: Sawmill    Comment: own business  Tobacco Use   Smoking status: Some Days    Types: Cigars    Passive exposure: Past   Smokeless tobacco: Current    Types: Snuff   Tobacco comments:    Occ cigar  Vaping Use   Vaping Use: Never used  Substance and Sexual Activity   Alcohol use: Yes    Alcohol/week: 0.0 standard drinks of alcohol    Comment: occ   Drug use: No   Sexual activity: Yes  Other Topics Concern   Not on file  Social History Narrative   Not on file   Social Determinants of Health   Financial Resource Strain: Not on file  Food Insecurity: Not on file  Transportation Needs: Not on file  Physical Activity: Not on file  Stress: Not on file  Social Connections: Not on file  Intimate Partner Violence: Not on file   Review of Systems  Constitutional:  Negative for fatigue and unexpected weight change.  Wears seat belt  HENT:  Negative for dental problem, hearing loss and tinnitus.        Keeps up with dentist  Eyes:  Negative for visual disturbance.       No diplopia or unilateral vision loss  Respiratory:  Negative for chest tightness and shortness of breath.        Some cough---lingering from minor cold a few weeks ago  Cardiovascular:  Negative for chest pain, palpitations and leg swelling.  Gastrointestinal:  Negative for blood in stool and constipation.       No heartburn  Endocrine: Negative for polydipsia and polyuria.  Genitourinary:  Negative for difficulty urinating and urgency.       No sexual problems  Musculoskeletal:  Negative for back pain and joint swelling.       Left shoulder and elbow pain  Skin:  Negative for rash.  Allergic/Immunologic: Positive for  environmental allergies. Negative for immunocompromised state.       Uses meds prn  Neurological:  Negative for dizziness, syncope, light-headedness and headaches.  Hematological:  Negative for adenopathy. Does not bruise/bleed easily.  Psychiatric/Behavioral:  Negative for dysphoric mood and sleep disturbance. The patient is not nervous/anxious.        Objective:   Physical Exam Constitutional:      Appearance: Normal appearance.  HENT:     Mouth/Throat:     Pharynx: No oropharyngeal exudate or posterior oropharyngeal erythema.  Eyes:     Conjunctiva/sclera: Conjunctivae normal.     Pupils: Pupils are equal, round, and reactive to light.  Cardiovascular:     Rate and Rhythm: Normal rate and regular rhythm.     Pulses: Normal pulses.     Heart sounds: No murmur heard.    No gallop.  Pulmonary:     Effort: Pulmonary effort is normal.     Breath sounds: Normal breath sounds. No wheezing or rales.  Abdominal:     Palpations: Abdomen is soft.     Tenderness: There is no abdominal tenderness.  Musculoskeletal:     Cervical back: Neck supple.     Right lower leg: No edema.     Left lower leg: No edema.  Lymphadenopathy:     Cervical: No cervical adenopathy.  Skin:    Findings: No lesion or rash.  Neurological:     General: No focal deficit present.     Mental Status: He is alert and oriented to person, place, and time.  Psychiatric:        Mood and Affect: Mood normal.        Behavior: Behavior normal.            Assessment & Plan:

## 2023-06-09 ENCOUNTER — Ambulatory Visit: Payer: 59 | Admitting: Internal Medicine

## 2023-06-09 ENCOUNTER — Encounter: Payer: Self-pay | Admitting: Internal Medicine

## 2023-06-09 VITALS — BP 122/88 | HR 80 | Temp 97.9°F | Ht 68.75 in | Wt 246.0 lb

## 2023-06-09 DIAGNOSIS — R739 Hyperglycemia, unspecified: Secondary | ICD-10-CM

## 2023-06-09 DIAGNOSIS — I10 Essential (primary) hypertension: Secondary | ICD-10-CM

## 2023-06-09 DIAGNOSIS — G4733 Obstructive sleep apnea (adult) (pediatric): Secondary | ICD-10-CM | POA: Diagnosis not present

## 2023-06-09 DIAGNOSIS — Z0000 Encounter for general adult medical examination without abnormal findings: Secondary | ICD-10-CM

## 2023-06-09 DIAGNOSIS — E785 Hyperlipidemia, unspecified: Secondary | ICD-10-CM

## 2023-06-09 DIAGNOSIS — Z23 Encounter for immunization: Secondary | ICD-10-CM

## 2023-06-09 LAB — COMPREHENSIVE METABOLIC PANEL
ALT: 28 U/L (ref 0–53)
AST: 21 U/L (ref 0–37)
Albumin: 4.7 g/dL (ref 3.5–5.2)
Alkaline Phosphatase: 52 U/L (ref 39–117)
BUN: 11 mg/dL (ref 6–23)
CO2: 30 meq/L (ref 19–32)
Calcium: 9.7 mg/dL (ref 8.4–10.5)
Chloride: 100 meq/L (ref 96–112)
Creatinine, Ser: 0.94 mg/dL (ref 0.40–1.50)
GFR: 99.11 mL/min (ref >60.00)
Glucose, Bld: 104 mg/dL — ABNORMAL HIGH (ref 70–99)
Potassium: 3.7 meq/L (ref 3.5–5.1)
Sodium: 138 meq/L (ref 135–145)
Total Bilirubin: 0.9 mg/dL (ref 0.2–1.2)
Total Protein: 7 g/dL (ref 6.0–8.3)

## 2023-06-09 LAB — CBC
HCT: 44.8 % (ref 39.0–52.0)
Hemoglobin: 14.8 g/dL (ref 13.0–17.0)
MCHC: 33.1 g/dL (ref 30.0–36.0)
MCV: 92.5 fL (ref 78.0–100.0)
Platelets: 236 10*3/uL (ref 150.0–400.0)
RBC: 4.85 Mil/uL (ref 4.22–5.81)
RDW: 13 % (ref 11.5–15.5)
WBC: 8.7 10*3/uL (ref 4.0–10.5)

## 2023-06-09 LAB — LIPID PANEL
Cholesterol: 192 mg/dL (ref 0–200)
HDL: 43.2 mg/dL (ref >39.00)
LDL Cholesterol: 104 mg/dL — ABNORMAL HIGH (ref 0–99)
NonHDL: 149.1
Total CHOL/HDL Ratio: 4
Triglycerides: 226 mg/dL — ABNORMAL HIGH (ref 0.0–149.0)
VLDL: 45.2 mg/dL — ABNORMAL HIGH (ref 0.0–40.0)

## 2023-06-09 LAB — HEMOGLOBIN A1C: Hgb A1c MFr Bld: 6.3 % (ref 4.6–6.5)

## 2023-06-09 MED ORDER — LOSARTAN POTASSIUM 100 MG PO TABS: ORAL_TABLET | ORAL | 3 refills | Status: DC

## 2023-06-09 MED ORDER — HYDROCHLOROTHIAZIDE 25 MG PO TABS: ORAL_TABLET | ORAL | 3 refills | Status: DC

## 2023-06-09 MED ORDER — ATORVASTATIN CALCIUM 40 MG PO TABS: 40.0000 mg | ORAL_TABLET | Freq: Every day | ORAL | 3 refills | Status: DC

## 2023-06-09 NOTE — Assessment & Plan Note (Signed)
Healthy Flu vaccine today Prefers no COVID vaccine Just had colonoscopy--will try to get the records

## 2023-06-09 NOTE — Progress Notes (Signed)
Subjective:    Patient ID: Jerry Leach, male    DOB: 1981-02-13, 42 y.o.   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 90 tablet 3   celecoxib (CELEBREX) 200 MG capsule Take 200 mg by mouth 2 (two) times daily.     cetirizine (ZYRTEC) 10 MG tablet Take 10 mg by mouth daily.     fluticasone (FLONASE) 50 MCG/ACT nasal spray Place 2 sprays into both nostrils daily.     hydrochlorothiazide (HYDRODIURIL) 25 MG tablet TAKE 1 TABLET BY MOUTH DAILY. TAKE WITH LOSARTAN 100 MG TABLET. 90 tablet 3   losartan (COZAAR) 100 MG tablet TAKE 1 TABLET BY MOUTH DAILY. TAKE WITH HYDROCHLOROTHIAZIDE 25 MG TABLET. 90 tablet 3   No current facility-administered medications on file prior to visit.    No Known Allergies  Past Medical History:  Diagnosis Date   Allergic rhinitis due to pollen    Hyperlipidemia    Hypertension     Past Surgical History:  Procedure Laterality Date   ESOPHAGOGASTRODUODENOSCOPY (EGD) WITH PROPOFOL N/A 10/20/2018   Procedure: ESOPHAGOGASTRODUODENOSCOPY (EGD) WITH PROPOFOL;  Surgeon: Vida Rigger, MD;  Location: [facility] ENDOSCOPY;  Service: Endoscopy;  Laterality: N/A;   FOREIGN BODY REMOVAL  10/20/2018   Procedure: FOREIGN BODY REMOVAL;  Surgeon: Vida Rigger, MD;  Location: [facility] ENDOSCOPY;  Service: Endoscopy;;   KNEE ARTHROSCOPY Left 10/01    Family History  Problem Relation Age of Onset   Hypertension Mother    Heart disease Father    Lung cancer Father    Heart disease Other    Hypertension Other    Cancer Neg Hx    Diabetes Neg Hx     Social History    Socioeconomic History   Marital status: Married    Spouse name: Not on file   Number of children: 2   Years of education: Not on file   Highest education level: Not on file  Occupational History   Occupation: IT sales professional    Comment: [employer]    Occupation: Engineer, structural for county also   Occupation: Sawmill    Comment: own business  Tobacco Use   Smoking status: Some Days    Types: Cigars    Passive exposure: Past   Smokeless tobacco: Current    Types: Snuff   Tobacco comments:    Occ cigar  Vaping Use   Vaping status: Never Used  Substance and Sexual Activity   Alcohol use: Yes    Alcohol/week: 0.0 standard drinks of alcohol    Comment: occ   Drug use: No   Sexual activity: Yes  Other Topics Concern   Not on file  Social History Narrative   Not on file   Social Determinants of Health   Financial Resource Strain: Not on file  Food Insecurity: Not on file  Transportation Needs: Not on file  Physical Activity: Not on file  Stress: Not on file  Social Connections: Not on file  Intimate Partner Violence: Not on file   Review of Systems  Constitutional:  Negative for unexpected weight change.       Wears seat  belt  HENT:  Negative for dental problem, hearing loss and tinnitus.        Keeps up with dentist  Eyes:        Mild decline in vision--going for another checks  Respiratory:  Negative for cough, chest tightness and shortness of breath.   Cardiovascular:  Negative for chest pain, palpitations and leg swelling.  Gastrointestinal:  Negative for blood in stool and constipation.       No heartburn  Endocrine: Negative for polydipsia and polyuria.  Genitourinary:  Negative for difficulty urinating and urgency.       No sexual problems  Musculoskeletal:  Negative for joint swelling.       Chronic low back tightness---stretching helps  Skin:  Negative for rash.  Allergic/Immunologic: Positive for environmental allergies. Negative for immunocompromised state.        Does okay with zyrtec  Neurological:  Negative for dizziness, syncope and light-headedness.       Some recent sinus pressure  Hematological:  Negative for adenopathy. Does not bruise/bleed easily.  Psychiatric/Behavioral:  Negative for dysphoric mood. The patient is not nervous/anxious.        Sleeps okay at home        Objective:   Physical Exam Constitutional:      Appearance: Normal appearance.  HENT:     Mouth/Throat:     Pharynx: No oropharyngeal exudate or posterior oropharyngeal erythema.  Eyes:     Conjunctiva/sclera: Conjunctivae normal.     Pupils: Pupils are equal, round, and reactive to light.  Cardiovascular:     Rate and Rhythm: Normal rate and regular rhythm.     Pulses: Normal pulses.     Heart sounds: No murmur heard.    No gallop.  Pulmonary:     Effort: Pulmonary effort is normal.     Breath sounds: Normal breath sounds. No wheezing or rales.  Abdominal:     Palpations: Abdomen is soft.     Tenderness: There is no abdominal tenderness.  Musculoskeletal:     Cervical back: Neck supple.     Right lower leg: No edema.     Left lower leg: No edema.  Lymphadenopathy:     Cervical: No cervical adenopathy.  Skin:    Findings: No lesion or rash.  Neurological:     General: No focal deficit present.     Mental Status: He is alert and oriented to person, place, and time.  Psychiatric:        Mood and Affect: Mood normal.        Behavior: Behavior normal.            Assessment & Plan:

## 2023-06-09 NOTE — Assessment & Plan Note (Signed)
On atorvastatin

## 2023-06-09 NOTE — Assessment & Plan Note (Signed)
Does well with CPAP--wears at home

## 2023-06-09 NOTE — Assessment & Plan Note (Signed)
BP Readings from Last 3 Encounters:  06/09/23 122/88  06/05/22 130/88  05/27/21 140/90   Good control on hydrochlorothiazide 25 and losartan 100

## 2023-06-09 NOTE — Assessment & Plan Note (Signed)
Discussed carb restriction

## 2023-11-29 ENCOUNTER — Encounter: Payer: Self-pay | Admitting: Internal Medicine

## 2023-11-29 ENCOUNTER — Ambulatory Visit: Admitting: Internal Medicine

## 2023-11-29 ENCOUNTER — Telehealth: Payer: Self-pay

## 2023-11-29 VITALS — BP 138/88 | HR 91 | Temp 98.3°F | Ht 68.75 in | Wt 229.0 lb

## 2023-11-29 DIAGNOSIS — F439 Reaction to severe stress, unspecified: Secondary | ICD-10-CM | POA: Insufficient documentation

## 2023-11-29 MED ORDER — DULOXETINE HCL 30 MG PO CPEP: 30.0000 mg | ORAL_CAPSULE | Freq: Every day | ORAL | 1 refills | Status: DC

## 2023-11-29 MED ORDER — LORAZEPAM 0.5 MG PO TABS: 0.2500 mg | ORAL_TABLET | Freq: Two times a day (BID) | ORAL | 0 refills | Status: DC | PRN

## 2023-11-29 NOTE — Progress Notes (Signed)
 Subjective:    Patient ID: Jerry Leach, male    DOB: 1981/03/21, 43 y.o.   MRN: [id number]  HPI Here due to anxiety  "I need something to control my anxiety Marriage is in trouble--his wife's choice--she has been cheating on him Still together for now--but she is probably going to leave Hard time functioning when outside of work Son is 18---and ready for college (doing on line and will stay with dad)  Can initiate sleep---but will wake 3-4 hours later and can't get back to sleep Has been exercising a lot--to distract himself Not eating great--very variable  Hasn't looked into counseling----is able to do his job  Current Outpatient Medications on File Prior to Visit  Medication Sig Dispense Refill   atorvastatin  (LIPITOR) 40 MG tablet Take 1 tablet (40 mg total) by mouth daily. 90 tablet 3   celecoxib (CELEBREX) 200 MG capsule Take 200 mg by mouth 2 (two) times daily.     cetirizine (ZYRTEC) 10 MG tablet Take 10 mg by mouth daily.     hydrochlorothiazide  (HYDRODIURIL ) 25 MG tablet TAKE 1 TABLET BY MOUTH DAILY. TAKE WITH LOSARTAN  100 MG TABLET. 90 tablet 3   losartan  (COZAAR ) 100 MG tablet TAKE 1 TABLET BY MOUTH DAILY. TAKE WITH HYDROCHLOROTHIAZIDE  25 MG TABLET. 90 tablet 3   No current facility-administered medications on file prior to visit.    No Known Allergies  Past Medical History:  Diagnosis Date   Allergic rhinitis due to pollen    Hyperlipidemia    Hypertension     Past Surgical History:  Procedure Laterality Date   ESOPHAGOGASTRODUODENOSCOPY (EGD) WITH PROPOFOL  N/A 10/20/2018   Procedure: ESOPHAGOGASTRODUODENOSCOPY (EGD) WITH PROPOFOL ;  Surgeon: Ozell Blunt, MD;  Location: [facility] ENDOSCOPY;  Service: Endoscopy;  Laterality: N/A;   FOREIGN BODY REMOVAL  10/20/2018   Procedure: FOREIGN BODY REMOVAL;  Surgeon: Ozell Blunt, MD;  Location: [facility] ENDOSCOPY;  Service: Endoscopy;;   KNEE ARTHROSCOPY Left 10/01    Family History  Problem Relation Age of Onset    Hypertension Mother    Heart disease Father    Lung cancer Father    Heart disease Other    Hypertension Other    Cancer Neg Hx    Diabetes Neg Hx     Social History   Socioeconomic History   Marital status: Married    Spouse name: Not on file   Number of children: 2   Years of education: Not on file   Highest education level: Associate degree: academic program  Occupational History   Occupation: IT sales professional    Comment: [employer]    Occupation: Engineer, structural for county also   Occupation: Sawmill    Comment: own business  Tobacco Use   Smoking status: Some Days    Types: Cigars    Passive exposure: Past   Smokeless tobacco: Current    Types: Snuff   Tobacco comments:    Occ cigar  Vaping Use   Vaping status: Never Used  Substance and Sexual Activity   Alcohol use: Yes    Alcohol/week: 0.0 standard drinks of alcohol    Comment: occ   Drug use: No   Sexual activity: Yes  Other Topics Concern   Not on file  Social History Narrative   Daughter died 27   1 son   Social Drivers of Corporate investment banker Strain: Low Risk  (11/27/2023)   Overall Financial Resource Strain (CARDIA)    Difficulty of Paying Living Expenses: Not hard at  all  Food Insecurity: No Food Insecurity (11/27/2023)   Hunger Vital Sign    Worried About Running Out of Food in the Last Year: Never true    Ran Out of Food in the Last Year: Never true  Transportation Needs: No Transportation Needs (11/27/2023)   PRAPARE - Administrator, Civil Service (Medical): No    Lack of Transportation (Non-Medical): No  Physical Activity: Insufficiently Active (11/27/2023)   Exercise Vital Sign    Days of Exercise per Week: 3 days    Minutes of Exercise per Session: 30 min  Stress: Stress Concern Present (11/27/2023)   [employer] of Occupational Health - Occupational Stress Questionnaire    Feeling of Stress : Very much  Social Connections: Moderately Integrated (11/27/2023)   Social  Connection and Isolation Panel [NHANES]    Frequency of Communication with Friends and Family: More than three times a week    Frequency of Social Gatherings with Friends and Family: More than three times a week    Attends Religious Services: 1 to 4 times per year    Active Member of [employer] or Organizations: No    Attends Engineer, structural: Not on file    Marital Status: Married  Catering manager Violence: Not on file   Review of Systems Has lost some weight      Objective:   Physical Exam Constitutional:      Appearance: Normal appearance.  Neurological:     Mental Status: He is alert.  Psychiatric:     Comments: Normal appearance and speech            Assessment & Plan:

## 2023-11-29 NOTE — Telephone Encounter (Signed)
 Pharmacist, Trevor Fudge, is asking for dose verification on Lorazepam. Verbal 0.5 mg is not sufficient. Order says 10 tablets per dose. Please resend to pharmacy.

## 2023-11-29 NOTE — Assessment & Plan Note (Signed)
 Marriage breaking up due to wife's infidelity Having a hard time with anxiety, restlessness and some depression Functioning okay at school Discussed options---he would like a daily medication Will start duloxetine 30mg ---and increase to 60mg  in 2 weeks if no side effects and doesn't feel anything Lorazepam 0.5 for prn use (mostly for sleep)

## 2023-11-29 NOTE — Patient Instructions (Addendum)
 If you don't have any side effects and haven't noticed any improvement after 2 weeks, increase the duloxetine to 60mg  daily (2 capsules)

## 2023-11-29 NOTE — Telephone Encounter (Signed)
 Copied from CRM [id number]. Topic: Clinical - Medication Question >> Nov 29, 2023 11:25 AM Allyne Areola wrote: Reason for CRM: [facility] [phone], is calling for clarification on the prescription they received for LORazepam (ATIVAN) 0.5 MG tablet. They would like for us  to check the dosage and instructions on the prescription.

## 2023-11-29 NOTE — Addendum Note (Signed)
 Addended by: Curt Dover I on: 11/29/2023 01:19 PM   Modules accepted: Orders

## 2023-11-29 NOTE — Telephone Encounter (Signed)
 I will get with Dr Joelle Musca. There is something wrong with the one that keeps populating and getting selected. It should be 0.5-1 not 10.

## 2023-11-29 NOTE — Telephone Encounter (Signed)
 Patient is currently at the pharmacy and called back to have someone speak to the pharmacist regarding dosage clarification. Please contact pharmacy to advise. LORazepam (ATIVAN) 0.5 MG tablet.

## 2023-12-09 ENCOUNTER — Telehealth: Payer: Self-pay

## 2023-12-09 ENCOUNTER — Telehealth: Payer: Self-pay | Admitting: Internal Medicine

## 2023-12-09 NOTE — Telephone Encounter (Signed)
 Left message to return call to office. Need to follow up regarding FMLA/disability forms.

## 2023-12-09 NOTE — Telephone Encounter (Signed)
 Type of form received: FMLA paperwork  Additional comments: NA  Received by: Delores Fester should be Faxed to:   Form should be emailed to:   [email or web address]  Is patient requesting call for pickup: YES   Form placed:  Provider folder  Attach charge sheet. YES  Individual made aware of 3-5 business day turn around (Y/N)? YES

## 2023-12-09 NOTE — Telephone Encounter (Signed)
 Copied from CRM [id number]. Topic: General - Other >> Dec 09, 2023  4:20 PM Juleen [place] F wrote: Reason for CRM: Patient returned Erin's phone call

## 2023-12-10 NOTE — Telephone Encounter (Signed)
 I will complete these once I am back in the office

## 2023-12-10 NOTE — Telephone Encounter (Signed)
 FMLA for Patient forms received for completion for patient. Patient has been informed that process may take up to 5 business days.  Employer Name [employer] Department Reason for being out: Anxiety  Any inpatient care: No Any Planned appointment? 5.24.25 follow up with Dr. Joelle Musca Patient is requesting start date of 6.1.25  Patient is requesting end date of 18.1.25  When did the condition start? 4.26.25 Best estimate of how long condition will last? TBD How many episodes day/ per week or month? 2-3 days How many hours? 24hr ( works 24hr shifts)  Verified with patient that it is ok to leave Voicemail updates on  Mobile [phone] (mobile)  Patient will pick up copy in our office as well as the original to email employer. Forms placed in providers box for review.

## 2023-12-16 DIAGNOSIS — Z0279 Encounter for issue of other medical certificate: Secondary | ICD-10-CM

## 2023-12-17 ENCOUNTER — Telehealth: Payer: Self-pay

## 2023-12-17 NOTE — Telephone Encounter (Signed)
 Copied from CRM [id number]. Topic: General - Inquiry >> Dec 17, 2023 12:43 PM [place] wrote: Reason for CRM: Patient needing Jerry Leach, RT to call him back regarding FMLA paperwork

## 2023-12-17 NOTE — Telephone Encounter (Signed)
 Pt called for status of FMLA ppw? Pt stated he needs ppw by today, to submit to his Employer. Spoke to [employer], ppw was comp by Dr. Joelle Musca. Spoke to [place], she states she has to review ppw before its given. Pt states he will arrive to office within 30-45 mins. Advised pt there is a $29 charge for ppw & there may be a wait if ppw isn't comp before his arrival. Call back # [phone]

## 2023-12-17 NOTE — Telephone Encounter (Signed)
 Folder given to Amieya. I believe she was giving it to [place].

## 2023-12-17 NOTE — Telephone Encounter (Signed)
 Completed paperwork received. Copy sent to scan. Patient picked up original copy at our office to give to employer.

## 2023-12-25 ENCOUNTER — Emergency Department (HOSPITAL_BASED_OUTPATIENT_CLINIC_OR_DEPARTMENT_OTHER)
Admission: EM | Admit: 2023-12-25 | Discharge: 2023-12-25 | Disposition: A | Discharge status: Home / Self Care | Attending: Emergency Medicine | Admitting: Emergency Medicine

## 2023-12-25 ENCOUNTER — Other Ambulatory Visit: Payer: Self-pay

## 2023-12-25 ENCOUNTER — Emergency Department (HOSPITAL_BASED_OUTPATIENT_CLINIC_OR_DEPARTMENT_OTHER): Admitting: Radiology

## 2023-12-25 ENCOUNTER — Encounter (HOSPITAL_BASED_OUTPATIENT_CLINIC_OR_DEPARTMENT_OTHER): Payer: Self-pay

## 2023-12-25 DIAGNOSIS — W109XXA Fall (on) (from) unspecified stairs and steps, initial encounter: Secondary | ICD-10-CM | POA: Diagnosis not present

## 2023-12-25 DIAGNOSIS — S2241XA Multiple fractures of ribs, right side, initial encounter for closed fracture: Secondary | ICD-10-CM | POA: Diagnosis not present

## 2023-12-25 DIAGNOSIS — R0789 Other chest pain: Secondary | ICD-10-CM | POA: Diagnosis present

## 2023-12-25 MED ORDER — OXYCODONE HCL 5 MG PO TABS: 5.0000 mg | ORAL_TABLET | Freq: Four times a day (QID) | ORAL | 0 refills | Status: DC | PRN

## 2023-12-25 NOTE — Discharge Instructions (Addendum)
 Recommend 1000 mg of Tylenol  every 6 hours as needed for pain.  Recommend 600 mg ibuprofen every 8 hours as needed for pain.  I have written you for a narcotic pain medicine called Roxicodone for breakthrough pain.  This medication is sedating so please be careful with its use.  Do not mix with alcohol drugs or dangerous activities including driving.  Recommend buy lidocaine  patches over-the-counter as well for the pain.  Return if you develop fever or worsening pain or any other concerning symptoms.

## 2023-12-25 NOTE — ED Triage Notes (Signed)
 Fell down 3 wooden steps, tripped over the dog last pm. Pain to right anterior ribs. Ibuprofen 1030 this am. Denies any other injuries. Did not hit head.

## 2023-12-25 NOTE — ED Provider Notes (Signed)
 [place] EMERGENCY DEPARTMENT AT [facility] Provider Note   CSN: [id number] Arrival date & time: 12/25/23  1338     History  Chief Complaint  Patient presents with   Jerry Leach    Jerry Leach is a 43 y.o. male.  Patient here with right-sided rib pain after fall last night.  Denies any abdominal pain hematuria nausea vomiting.  Denies any chest pain.  Denies any major difficulty breathing.  Hurts mostly when he moves or takes a deep breath.  Fell over the dog.  Did not hit his head or lose consciousness.  Not on blood thinners.  The history is provided by the patient.       Home Medications Prior to Admission medications   Medication Sig Start Date End Date Taking? Authorizing Provider  oxyCODONE (ROXICODONE) 5 MG immediate release tablet Take 1 tablet (5 mg total) by mouth every 6 (six) hours as needed for up to 10 doses. 12/25/23  Yes Zayden Hahne, DO  atorvastatin  (LIPITOR) 40 MG tablet Take 1 tablet (40 mg total) by mouth daily. 06/09/23   Helaine Llanos, MD  celecoxib (CELEBREX) 200 MG capsule Take 200 mg by mouth 2 (two) times daily. 05/05/22   [provider]  cetirizine (ZYRTEC) 10 MG tablet Take 10 mg by mouth daily.    [provider]  DULoxetine  (CYMBALTA ) 30 MG capsule Take 1-2 capsules (30-60 mg total) by mouth daily. 11/29/23   Helaine Llanos, MD  hydrochlorothiazide  (HYDRODIURIL ) 25 MG tablet TAKE 1 TABLET BY MOUTH DAILY. TAKE WITH LOSARTAN  100 MG TABLET. 06/09/23   Helaine Llanos, MD  LORazepam  (ATIVAN ) 0.5 MG tablet Take 0.5-1 tablets (0.25-0.5 mg total) by mouth 2 (two) times daily as needed for anxiety. 11/29/23   Helaine Llanos, MD  losartan  (COZAAR ) 100 MG tablet TAKE 1 TABLET BY MOUTH DAILY. TAKE WITH HYDROCHLOROTHIAZIDE  25 MG TABLET. 06/09/23   Helaine Llanos, MD      Allergies    Patient has no known allergies.    Review of Systems   Review of Systems  Physical Exam Updated Vital Signs BP (!) 146/100  (BP Location: Left Arm)   Pulse 99   Temp 97.7 F (36.5 C) (Oral)   Resp 16   Ht 5\' 9"  (1.753 m)   Wt 104.3 kg   SpO2 100%   BMI 33.97 kg/m  Physical Exam Vitals and nursing note reviewed.  Constitutional:      General: He is not in acute distress.    Appearance: He is well-developed. He is not ill-appearing.  HENT:     Head: Normocephalic and atraumatic.     Nose: Nose normal.     Mouth/Throat:     Mouth: Mucous membranes are moist.  Eyes:     Extraocular Movements: Extraocular movements intact.     Conjunctiva/sclera: Conjunctivae normal.     Pupils: Pupils are equal, round, and reactive to light.  Cardiovascular:     Rate and Rhythm: Normal rate and regular rhythm.     Pulses: Normal pulses.     Heart sounds: No murmur heard. Pulmonary:     Effort: Pulmonary effort is normal. No respiratory distress.     Breath sounds: Normal breath sounds.  Abdominal:     Palpations: Abdomen is soft.     Tenderness: There is no abdominal tenderness.  Musculoskeletal:        General: Tenderness present. No swelling.     Cervical back: Normal range of motion and  neck supple. No tenderness.     Comments: Tenderness of the right side of the ribs  Skin:    General: Skin is warm and dry.     Capillary Refill: Capillary refill takes less than 2 seconds.  Neurological:     General: No focal deficit present.     Mental Status: He is alert.  Psychiatric:        Mood and Affect: Mood normal.     ED Results / Procedures / Treatments   Labs (all labs ordered are listed, but only abnormal results are displayed) Labs Reviewed - No data to display  EKG None  Radiology DG Ribs Unilateral W/Chest Right Result Date: 12/25/2023 CLINICAL DATA:  Small and chest wall pain. EXAM: RIGHT RIBS AND CHEST - 3+ VIEW COMPARISON:  None Available. FINDINGS: No focal consolidation, pleural effusion, pneumothorax. The cardiac silhouette is within normal limits. Mildly displaced fracture of the  posterolateral right seventh and eighth ribs. IMPRESSION: 1. No active cardiopulmonary disease. 2. Mildly displaced fractures of the right seventh and eighth ribs. Electronically Signed   By: Angus Bark M.D.   On: 12/25/2023 14:17    Procedures Procedures    Medications Ordered in ED Medications - No data to display  ED Course/ Medical Decision Making/ A&P                                 Medical Decision Making Amount and/or Complexity of Data Reviewed Radiology: ordered.  Risk Prescription drug management.   Jerry Leach is here with right-sided rib pain after fall last night.  Differential diagnosis rib fracture versus rib contusion.  He is not having any hematuria abdominal pain have no concern for any intra-abdominal process/injury or other acute traumatic process.  Did not hit his head or lose consciousness.  This occurred last night.  He is not on blood thinners.  X-ray showed right 7th and 8th rib fractures.  No pneumothorax.  Ultimately pain is pretty well-controlled but worse and he takes a deep breath and moves.  Recommend Tylenol  ibuprofen lidocaine  patches and will prescribe Roxicodone for breakthrough pain.  Understands return precautions.  I do not have any concern for intra-abdominal injury or other acute process at this time but he was told to return if symptoms worsen.  Discharge.  This chart was dictated using voice recognition software.  Despite best efforts to proofread,  errors can occur which can change the documentation meaning.         Final Clinical Impression(s) / ED Diagnoses Final diagnoses:  Closed fracture of multiple ribs of right side, initial encounter    Rx / DC Orders ED Discharge Orders          Ordered    oxyCODONE (ROXICODONE) 5 MG immediate release tablet  Every 6 hours PRN        12/25/23 1425              Dinia Joynt, DO 12/25/23 1427

## 2024-01-04 ENCOUNTER — Encounter: Payer: Self-pay | Admitting: Internal Medicine

## 2024-01-11 ENCOUNTER — Ambulatory Visit: Admitting: Internal Medicine

## 2024-01-11 ENCOUNTER — Encounter: Payer: Self-pay | Admitting: Internal Medicine

## 2024-01-11 VITALS — BP 138/88 | HR 82 | Temp 98.1°F

## 2024-01-11 DIAGNOSIS — S2249XA Multiple fractures of ribs, unspecified side, initial encounter for closed fracture: Secondary | ICD-10-CM | POA: Insufficient documentation

## 2024-01-11 DIAGNOSIS — S2241XD Multiple fractures of ribs, right side, subsequent encounter for fracture with routine healing: Secondary | ICD-10-CM

## 2024-01-11 DIAGNOSIS — F439 Reaction to severe stress, unspecified: Secondary | ICD-10-CM | POA: Diagnosis not present

## 2024-01-11 MED ORDER — CYCLOBENZAPRINE HCL 10 MG PO TABS: 10.0000 mg | ORAL_TABLET | Freq: Every evening | ORAL | 0 refills | Status: AC | PRN

## 2024-01-11 NOTE — Progress Notes (Signed)
 Subjective:    Patient ID: Jerry Leach, male    DOB: 1981-05-10, 43 y.o.   MRN: [id number]  HPI Here for follow up of situational anxiety  Feels the medication is helping Wife is leaving tomorrow----the legal separation will start then He is staying in house--son staying with him Has only needed the lorazepam  once  [facility] 6/7 Rib fractures Doesn't like the oxycodone  Is taking ibuprofen 800 bid Still gets pain depending on how he moves---and worse with cough (had some sinus issues but this is improving) Is out of work anyway   Current Outpatient Medications on File Prior to Visit  Medication Sig Dispense Refill   atorvastatin  (LIPITOR) 40 MG tablet Take 1 tablet (40 mg total) by mouth daily. 90 tablet 3   celecoxib (CELEBREX) 200 MG capsule Take 200 mg by mouth 2 (two) times daily.     cetirizine (ZYRTEC) 10 MG tablet Take 10 mg by mouth daily.     DULoxetine  (CYMBALTA ) 30 MG capsule Take 1-2 capsules (30-60 mg total) by mouth daily. 60 capsule 1   hydrochlorothiazide  (HYDRODIURIL ) 25 MG tablet TAKE 1 TABLET BY MOUTH DAILY. TAKE WITH LOSARTAN  100 MG TABLET. 90 tablet 3   losartan  (COZAAR ) 100 MG tablet TAKE 1 TABLET BY MOUTH DAILY. TAKE WITH HYDROCHLOROTHIAZIDE  25 MG TABLET. 90 tablet 3   LORazepam  (ATIVAN ) 0.5 MG tablet Take 0.5-1 tablets (0.25-0.5 mg total) by mouth 2 (two) times daily as needed for anxiety. 30 tablet 0   No current facility-administered medications on file prior to visit.    No Known Allergies  Past Medical History:  Diagnosis Date   Allergic rhinitis due to pollen    Hyperlipidemia    Hypertension     Past Surgical History:  Procedure Laterality Date   ESOPHAGOGASTRODUODENOSCOPY (EGD) WITH PROPOFOL  N/A 10/20/2018   Procedure: ESOPHAGOGASTRODUODENOSCOPY (EGD) WITH PROPOFOL ;  Surgeon: Rosalie Kitchens, MD;  Location: [facility] ENDOSCOPY;  Service: Endoscopy;  Laterality: N/A;   FOREIGN BODY REMOVAL  10/20/2018   Procedure: FOREIGN BODY REMOVAL;  Surgeon:  Rosalie Kitchens, MD;  Location: [facility] ENDOSCOPY;  Service: Endoscopy;;   KNEE ARTHROSCOPY Left 10/01    Family History  Problem Relation Age of Onset   Hypertension Mother    Heart disease Father    Lung cancer Father    Heart disease Other    Hypertension Other    Cancer Neg Hx    Diabetes Neg Hx     Social History   Socioeconomic History   Marital status: Married    Spouse name: Not on file   Number of children: 2   Years of education: Not on file   Highest education level: Associate degree: academic program  Occupational History   Occupation: IT sales professional    Comment: [employer]    Occupation: Engineer, structural for county also   Occupation: [employer]    Comment: own business  Tobacco Use   Smoking status: Some Days    Types: Cigars    Passive exposure: Past   Smokeless tobacco: Current    Types: Snuff   Tobacco comments:    Occ cigar  Vaping Use   Vaping status: Never Used  Substance and Sexual Activity   Alcohol use: Yes    Alcohol/week: 0.0 standard drinks of alcohol    Comment: occ   Drug use: No   Sexual activity: Yes  Other Topics Concern   Not on file  Social History Narrative   Daughter died 10   1 son   Social  Drivers of Corporate investment banker Strain: Low Risk  (11/27/2023)   Overall Financial Resource Strain (CARDIA)    Difficulty of Paying Living Expenses: Not hard at all  Food Insecurity: No Food Insecurity (11/27/2023)   Hunger Vital Sign    Worried About Running Out of Food in the Last Year: Never true    Ran Out of Food in the Last Year: Never true  Transportation Needs: No Transportation Needs (11/27/2023)   PRAPARE - Administrator, Civil Service (Medical): No    Lack of Transportation (Non-Medical): No  Physical Activity: Insufficiently Active (11/27/2023)   Exercise Vital Sign    Days of Exercise per Week: 3 days    Minutes of Exercise per Session: 30 min  Stress: Stress Concern Present (11/27/2023)   [employer] of  Occupational Health - Occupational Stress Questionnaire    Feeling of Stress : Very much  Social Connections: Moderately Integrated (11/27/2023)   Social Connection and Isolation Panel    Frequency of Communication with Friends and Family: More than three times a week    Frequency of Social Gatherings with Friends and Family: More than three times a week    Attends Religious Services: 1 to 4 times per year    Active Member of [employer] or Organizations: No    Attends Engineer, structural: Not on file    Marital Status: Married  Catering manager Violence: Not on file   Review of Systems Sleeping better--other than with the rib fracture Scheduled to go back to work 7/1---now will delay due to the rib fracture (tentative plan to return August 1st)     Objective:   Physical Exam Constitutional:      Appearance: Normal appearance.   Neurological:     Mental Status: He is alert.   Psychiatric:        Mood and Affect: Mood normal.        Behavior: Behavior normal.            Assessment & Plan:

## 2024-01-11 NOTE — Assessment & Plan Note (Signed)
 Will increase ibuprofen to 800 tid Will Rx flexeril 10mg  --just for bedtime

## 2024-01-11 NOTE — Assessment & Plan Note (Signed)
 Much better---feels the duloxetine  helped, but may just be evolution and adjustment to the situation Will plan to continue for the next 6 months Lorazepam  for prn (rare)

## 2024-01-27 ENCOUNTER — Other Ambulatory Visit: Payer: Self-pay | Admitting: Internal Medicine

## 2024-04-10 ENCOUNTER — Telehealth: Payer: Self-pay

## 2024-04-10 NOTE — Telephone Encounter (Signed)
 PT has been scheduled nfn

## 2024-04-10 NOTE — Telephone Encounter (Signed)
 Paper received from [facility] [employer] regarding pt's CPAP supplies. Pt has not been seen in over 3 years with no scheduled f/u appt. Pt is not a current pt at our office and would need a new pt appt before anything can be signed or prescribed to him by pulmonary. Routing to the front desk to see if they are able to schedule a new pt appt.

## 2024-04-12 ENCOUNTER — Ambulatory Visit (INDEPENDENT_AMBULATORY_CARE_PROVIDER_SITE_OTHER): Admitting: Nurse Practitioner

## 2024-04-12 ENCOUNTER — Encounter: Payer: Self-pay | Admitting: Nurse Practitioner

## 2024-04-12 VITALS — BP 120/78 | HR 94 | Temp 98.1°F | Ht 69.0 in | Wt 216.6 lb

## 2024-04-12 DIAGNOSIS — E66811 Obesity, class 1: Secondary | ICD-10-CM

## 2024-04-12 DIAGNOSIS — G4733 Obstructive sleep apnea (adult) (pediatric): Secondary | ICD-10-CM

## 2024-04-12 DIAGNOSIS — Z6831 Body mass index (BMI) 31.0-31.9, adult: Secondary | ICD-10-CM | POA: Diagnosis not present

## 2024-04-12 NOTE — Patient Instructions (Signed)
 We will plan a repeat sleep study to see if you still have sleep apnea or if this has resolved with weight loss   We discussed how untreated sleep apnea puts an individual at risk for cardiac arrhthymias, pulm HTN, DM, stroke and increases their risk for daytime accidents. We also briefly reviewed treatment options including weight loss, side sleeping position, oral appliance, CPAP therapy or referral to ENT for possible surgical options  Use caution when driving and pull over if you become sleepy.  Follow up in 6 weeks with Katie Conner Neiss,NP to go over sleep study results, or sooner, if needed. Friday PM virtual clinic preferred

## 2024-04-12 NOTE — Progress Notes (Signed)
 @Patient  ID: Jerry Leach, male    DOB: [birth year], 43 y.o.   MRN: [id number]  Chief Complaint  Patient presents with   Consult    Sleep-snoring, saw here 3 yrs. ago    Referring provider: Jimmy Charlie FERNS, MD  HPI: 43 year old male, former smoker referred for sleep consult. 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 Past medical history significant for HTN, allergic rhinitis, HLD.   TEST/EVENTS:   04/12/2024: Today - sleep consult Discussed the use of AI scribe software for clinical note transcription with the patient, who gave verbal consent to proceed.  History of Present Illness Jerry Leach is a 43 year old male with mild sleep apnea who presents for evaluation of CPAP use and sleep apnea management.  He has a history of mild sleep apnea and has been using CPAP intermittently. Over the past two years, he has experienced significant weight loss of approximately 40 pounds.  He does not use the CPAP most nights. Currently, he has enough supplies and does not express a strong need for more at this time.  No issues with drowsy driving and his energy levels are relatively okay. He does not notice a significant difference in his condition on nights when he uses the CPAP compared to when he does not.  He is a Company secretary. Operates heavy machinery. No issues with alertness on the job. Goes to bed about 10pm-midnight. Falls asleep within 10 min. Wakes 2 times a night. Gets up around 6-7 am. No supplemental O2.   [place] 2  03/12/2024-04/10/2024: CPAP 5-12 cmH2O 10/30 days; 33% >4 hr; average use 8 hr 32 min Pressure 95th 9 Leaks 95th 1.4 AHI 0.3    No Known Allergies  Immunization History  Administered Date(s) Administered   Influenza, Seasonal, Injecte, Preservative Fre 06/09/2023   Influenza,inj,Quad PF,6+ Mos 05/09/2014, 05/17/2015, 04/22/2016, 04/30/2017, 05/13/2018, 05/22/2020, 05/27/2021, 06/05/2022    Influenza-Unspecified 04/12/2019   Tdap 03/07/2014    Past Medical History:  Diagnosis Date   Allergic rhinitis due to pollen    Hyperlipidemia    Hypertension     Tobacco History: Social History   Tobacco Use  Smoking Status Former   Types: Cigars   Passive exposure: Past  Smokeless Tobacco Current   Types: Snuff  Tobacco Comments   Occ cigar   Ready to quit: Not Answered Counseling given: Not Answered Tobacco comments: Occ cigar   Outpatient Medications Prior to Visit  Medication Sig Dispense Refill   atorvastatin  (LIPITOR) 40 MG tablet Take 1 tablet (40 mg total) by mouth daily. 90 tablet 3   cetirizine (ZYRTEC) 10 MG tablet Take 10 mg by mouth daily.     cyclobenzaprine  (FLEXERIL ) 10 MG tablet Take 1 tablet (10 mg total) by mouth at bedtime as needed for muscle spasms. 30 tablet 0   hydrochlorothiazide  (HYDRODIURIL ) 25 MG tablet TAKE 1 TABLET BY MOUTH DAILY. TAKE WITH LOSARTAN  100 MG TABLET. 90 tablet 3   losartan  (COZAAR ) 100 MG tablet TAKE 1 TABLET BY MOUTH DAILY. TAKE WITH HYDROCHLOROTHIAZIDE  25 MG TABLET. 90 tablet 3   celecoxib (CELEBREX) 200 MG capsule Take 200 mg by mouth 2 (two) times daily. (Patient not taking: Reported on 04/12/2024)     DULoxetine  (CYMBALTA ) 30 MG capsule TAKE 1 TO 2 CAPSULES BY MOUTH DAILY. (Patient not taking: Reported on 04/12/2024) 60 capsule 4   No facility-administered medications prior to visit.     Review of Systems:  As above    Physical Exam:  BP 120/78 (BP Location: Left Arm, Patient Position: Sitting, Cuff Size: Large)   Pulse 94   Temp 98.1 F (36.7 C) (Oral)   Ht 5' 9 (1.753 m)   Wt 216 lb 9.6 oz (98.2 kg)   SpO2 99%   BMI 31.99 kg/m   GEN: Pleasant, interactive, well-appearing; obese; in no acute distress HEENT:  Normocephalic and atraumatic. PERRLA. Sclera white. Nasal turbinates pink, moist and patent bilaterally. No rhinorrhea present. Oropharynx pink and moist, without exudate or edema. No lesions,  ulcerations, or postnasal drip. Mallampati III NECK:  Supple w/ fair ROM. No lymphadenopathy.   CV: RRR, no m/r/g PULMONARY:  Unlabored, regular breathing. Clear bilaterally A&P w/o wheezes/rales/rhonchi. No accessory muscle use.  GI: BS present and normoactive. Soft, non-tender to palpation MSK: No erythema, warmth or tenderness. Cap refil <2 sec all extrem.  Neuro: A/Ox3. No focal deficits noted.   Skin: Warm, no lesions or rashe Psych: Normal affect and behavior. Judgement and thought content appropriate.     Lab Results:  CBC    Component Value Date/Time   WBC 8.7 06/09/2023 0831   RBC 4.85 06/09/2023 0831   HGB 14.8 06/09/2023 0831   HCT 44.8 06/09/2023 0831   PLT 236.0 06/09/2023 0831   MCV 92.5 06/09/2023 0831   MCH 30.7 02/15/2015 1805   MCHC 33.1 06/09/2023 0831   RDW 13.0 06/09/2023 0831    BMET    Component Value Date/Time   NA 138 06/09/2023 0831   K 3.7 06/09/2023 0831   CL 100 06/09/2023 0831   CO2 30 06/09/2023 0831   GLUCOSE 104 (H) 06/09/2023 0831   BUN 11 06/09/2023 0831   CREATININE 0.94 06/09/2023 0831   CALCIUM  9.7 06/09/2023 0831   GFRNONAA >60 02/15/2015 1805   GFRAA >60 02/15/2015 1805    BNP No results found for: BNP   Imaging:  No results found.  Administration History     None           No data to display          No results found for: NITRICOXIDE      Assessment & Plan:   OSA (obstructive sleep apnea) Hx of mild OSA. Inconsistent use of CPAP. Has had 40 lb weight loss; no significant benefit from CPAP use. Symptom burden overall minimal. Discussed minimal cardiovascular risks associated with mild OSA. Will plan to repeat HST off CPAP to reassess severity of OSA and determine necessity of continued PAP therapy. Continued healthy weight loss encouraged. Safe driving practices reviewed.   Patient Instructions  We will plan a repeat sleep study to see if you still have sleep apnea or if this has resolved with  weight loss   We discussed how untreated sleep apnea puts an individual at risk for cardiac arrhthymias, pulm HTN, DM, stroke and increases their risk for daytime accidents. We also briefly reviewed treatment options including weight loss, side sleeping position, oral appliance, CPAP therapy or referral to ENT for possible surgical options  Use caution when driving and pull over if you become sleepy.  Follow up in 6 weeks with Katie Kecia Swoboda,NP to go over sleep study results, or sooner, if needed. Friday PM virtual clinic preferred       Obesity (BMI 30.0-34.9) BMI 31. Healthy weight management encouraged.    Advised if symptoms do not improve or worsen, to please contact office for sooner follow up or seek emergency care.   I  spent 35 minutes of dedicated to the care of this patient on the date of this encounter to include pre-visit review of records, face-to-face time with the patient discussing conditions above, post visit ordering of testing, clinical documentation with the electronic health record, making appropriate referrals as documented, and communicating necessary findings to members of the patients care team.  Comer LULLA Rouleau, NP 04/13/2024  Pt aware and understands NP's role.

## 2024-04-13 ENCOUNTER — Encounter: Payer: Self-pay | Admitting: Nurse Practitioner

## 2024-04-13 DIAGNOSIS — E66811 Obesity, class 1: Secondary | ICD-10-CM | POA: Insufficient documentation

## 2024-04-13 NOTE — Assessment & Plan Note (Signed)
 Hx of mild OSA. Inconsistent use of CPAP. Has had 40 lb weight loss; no significant benefit from CPAP use. Symptom burden overall minimal. Discussed minimal cardiovascular risks associated with mild OSA. Will plan to repeat HST off CPAP to reassess severity of OSA and determine necessity of continued PAP therapy. Continued healthy weight loss encouraged. Safe driving practices reviewed.   Patient Instructions  We will plan a repeat sleep study to see if you still have sleep apnea or if this has resolved with weight loss   We discussed how untreated sleep apnea puts an individual at risk for cardiac arrhthymias, pulm HTN, DM, stroke and increases their risk for daytime accidents. We also briefly reviewed treatment options including weight loss, side sleeping position, oral appliance, CPAP therapy or referral to ENT for possible surgical options  Use caution when driving and pull over if you become sleepy.  Follow up in 6 weeks with Jerry Patsie Mccardle,NP to go over sleep study results, or sooner, if needed. Friday PM virtual clinic preferred

## 2024-04-13 NOTE — Assessment & Plan Note (Signed)
 BMI 31. Healthy weight management encouraged.

## 2024-04-26 ENCOUNTER — Encounter

## 2024-04-26 DIAGNOSIS — G4733 Obstructive sleep apnea (adult) (pediatric): Secondary | ICD-10-CM

## 2024-05-14 ENCOUNTER — Telehealth: Payer: Self-pay | Admitting: Pulmonary Disease

## 2024-05-14 DIAGNOSIS — R069 Unspecified abnormalities of breathing: Secondary | ICD-10-CM | POA: Diagnosis not present

## 2024-05-14 DIAGNOSIS — G4733 Obstructive sleep apnea (adult) (pediatric): Secondary | ICD-10-CM

## 2024-05-14 NOTE — Telephone Encounter (Signed)
 Call patient  Sleep study result  Date of study: 04/26/2024  Impression: Mild obstructive sleep apnea with mild oxygen desaturations AHI of 8.5, O2 nadir of 86% Saturations below 88% for 0.5 minutes  Recommendation: Options for treating mild obstructive sleep apnea may include CPAP therapy if there are significant daytime symptoms or notable comorbidities. Auto CPAP 5-15 with heated humidification and the patient's preferred mask may be considered; other treatment options may include an oral device, watchful waiting with significant weight loss efforts.  Close clinical follow-up optimization of treatment

## 2024-05-15 NOTE — Telephone Encounter (Signed)
 Patient informed,order for change in settings and supplies

## 2024-06-02 ENCOUNTER — Encounter: Payer: Self-pay | Admitting: Nurse Practitioner

## 2024-06-02 ENCOUNTER — Telehealth (INDEPENDENT_AMBULATORY_CARE_PROVIDER_SITE_OTHER): Admitting: Nurse Practitioner

## 2024-06-02 DIAGNOSIS — G4733 Obstructive sleep apnea (adult) (pediatric): Secondary | ICD-10-CM | POA: Diagnosis not present

## 2024-06-02 NOTE — Patient Instructions (Signed)
 Ok to come off of CPAP. There is minimal risks on the heart associated with mild sleep apnea.  We discussed that insurance will not continue to cover CPAP if you're not using it 70% of the nights above 4 hours.   Follow up in one year as needed

## 2024-06-02 NOTE — Assessment & Plan Note (Signed)
 Mild OSA. Asymptomatic. Only utilizing CPAP on nights he is very tired. Does not feel differently without it. Discussed minimal cardiovascular risks associated with mild OSA. Advised that if he is not utilizing PAP machine >/70% of the nights >4 hr, insurance will not pay to renew supplies or CPAP. Verbalized understanding. Safe driving practices reviewed. Continued healthy weight management.  Patient Instructions  Ok to come off of CPAP. There is minimal risks on the heart associated with mild sleep apnea.  We discussed that insurance will not continue to cover CPAP if you're not using it 70% of the nights above 4 hours.   Follow up in one year as needed

## 2024-06-02 NOTE — Progress Notes (Signed)
 @Patient  ID: Jerry Leach, male    DOB: 1981/02/23, 43 y.o.   MRN: [id number]  Chief Complaint  Patient presents with   Sleep Apnea    Doing well.  Pressure settings for CPAP is good.  Only using when very tired.    Referring provider: Jimmy Charlie FERNS, MD  Virtual Visit via Video Note  I connected with Jerry Leach on 06/02/24 at  8:30 AM EST by a video enabled telemedicine application and verified that I am speaking with the correct person using two identifiers.  Location: Patient: Home Provider: Office   I discussed the limitations of evaluation and management by telemedicine and the availability of in person appointments. The patient expressed understanding and agreed to proceed.  History of Present Illness: 43 year old male, former smoker followed for sleep apnea. 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 Past medical history significant for HTN, allergic rhinitis, HLD.   TEST/EVENTS:  04/26/2024 HST: AHI 8/h >> mild OSA  04/12/2024: OV with Elaina Cara NP Jerry Leach is a 43 year old male with mild sleep apnea who presents for evaluation of CPAP use and sleep apnea management. He has a history of mild sleep apnea and has been using CPAP intermittently. Over the past two years, he has experienced significant weight loss of approximately 40 pounds. He does not use the CPAP most nights. Currently, he has enough supplies and does not express a strong need for more at this time. No issues with drowsy driving and his energy levels are relatively okay. He does not notice a significant difference in his condition on nights when he uses the CPAP compared to when he does not. 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 Goes to bed about 10pm-midnight. Falls asleep within 10 min. Wakes 2 times a night. Gets up around 6-7 am. No supplemental O2.  Epworth 2 03/12/2024-04/10/2024: CPAP 5-12  cmH2O 10/30 days; 33% >4 hr; average use 8 hr 32 min Pressure 95th 9 Leaks 95th 1.4 AHI 0.3  06/02/2024: Today - follow up Patient presents today for follow up. He had a repeat HST after his last visit. He has a mild sleep apnea still. He has lost 40 lb and feels better regarding his sleep. He only uses the CPAP when he is very tired. Otherwise, sleep is restful without it and energy levels are good the next day. No issues with drowsy driving. No issues with pressures when wearing his CPAP.     No Known Allergies  Immunization History  Administered Date(s) Administered   Influenza, Seasonal, Injecte, Preservative Fre 06/09/2023   Influenza,inj,Quad PF,6+ Mos 05/09/2014, 05/17/2015, 04/22/2016, 04/30/2017, 05/13/2018, 05/22/2020, 05/27/2021, 06/05/2022   Influenza-Unspecified 04/12/2019   Tdap 03/07/2014    Past Medical History:  Diagnosis Date   Allergic rhinitis due to pollen    Hyperlipidemia    Hypertension     Tobacco History: Social History   Tobacco Use  Smoking Status Former   Types: Cigars   Passive exposure: Past  Smokeless Tobacco Current   Types: Snuff  Tobacco Comments   Occ cigar   Ready to quit: Not Answered Counseling given: Not Answered Tobacco comments: Occ cigar   Outpatient Medications Prior to Visit  Medication Sig Dispense Refill   atorvastatin  (LIPITOR) 40 MG tablet Take 1 tablet (40 mg total) by mouth daily. 90 tablet 3   cetirizine (ZYRTEC) 10 MG tablet Take 10  mg by mouth daily.     cyclobenzaprine  (FLEXERIL ) 10 MG tablet Take 1 tablet (10 mg total) by mouth at bedtime as needed for muscle spasms. 30 tablet 0   hydrochlorothiazide  (HYDRODIURIL ) 25 MG tablet TAKE 1 TABLET BY MOUTH DAILY. TAKE WITH LOSARTAN  100 MG TABLET. 90 tablet 3   losartan  (COZAAR ) 100 MG tablet TAKE 1 TABLET BY MOUTH DAILY. TAKE WITH HYDROCHLOROTHIAZIDE  25 MG TABLET. 90 tablet 3   DULoxetine  (CYMBALTA ) 30 MG capsule TAKE 1 TO 2 CAPSULES BY MOUTH DAILY. 60 capsule 4    ibuprofen (ADVIL) 200 MG tablet Take 200 mg by mouth every 6 (six) hours as needed for mild pain (pain score 1-3).     Omega-3 Fatty Acids (FISH OIL) 1000 MG CAPS as directed Orally once a day     celecoxib (CELEBREX) 200 MG capsule Take 200 mg by mouth 2 (two) times daily. (Patient not taking: Reported on 04/12/2024)     No facility-administered medications prior to visit.     Review of Systems: As above    Physical Exam: Patient is well-developed, well-nourished in no acute distress.  Resting comfortably at home.  No labored breathing.  Speech is clear and coherent with logical content.  Patient is alert and oriented at baseline.    Assessment & Plan:   OSA (obstructive sleep apnea) Mild OSA. Asymptomatic. Only utilizing CPAP on nights he is very tired. Does not feel differently without it. Discussed minimal cardiovascular risks associated with mild OSA. Advised that if he is not utilizing PAP machine >/70% of the nights >4 hr, insurance will not pay to renew supplies or CPAP. Verbalized understanding. Safe driving practices reviewed. Continued healthy weight management.  Patient Instructions  Ok to come off of CPAP. There is minimal risks on the heart associated with mild sleep apnea.  We discussed that insurance will not continue to cover CPAP if you're not using it 70% of the nights above 4 hours.   Follow up in one year as needed       I discussed the assessment and treatment plan with the patient. The patient was provided an opportunity to ask questions and all were answered. The patient agreed with the plan and demonstrated an understanding of the instructions.   The patient was advised to call back or seek an in-person evaluation if the symptoms worsen or if the condition fails to improve as anticipated.  I provided 21 minutes of non-face-to-face time during this encounter.  Comer LULLA Rouleau, NP 06/02/2024  Pt aware and understands NP's role.

## 2024-06-12 ENCOUNTER — Ambulatory Visit: Payer: 59 | Admitting: Nurse Practitioner

## 2024-06-12 ENCOUNTER — Encounter: Payer: Self-pay | Admitting: Nurse Practitioner

## 2024-06-12 VITALS — BP 132/90 | HR 76 | Temp 98.0°F | Ht 68.25 in | Wt 214.0 lb

## 2024-06-12 DIAGNOSIS — Z87891 Personal history of nicotine dependence: Secondary | ICD-10-CM

## 2024-06-12 DIAGNOSIS — E785 Hyperlipidemia, unspecified: Secondary | ICD-10-CM

## 2024-06-12 DIAGNOSIS — I10 Essential (primary) hypertension: Secondary | ICD-10-CM

## 2024-06-12 DIAGNOSIS — Z131 Encounter for screening for diabetes mellitus: Secondary | ICD-10-CM

## 2024-06-12 DIAGNOSIS — G4733 Obstructive sleep apnea (adult) (pediatric): Secondary | ICD-10-CM

## 2024-06-12 DIAGNOSIS — S76211A Strain of adductor muscle, fascia and tendon of right thigh, initial encounter: Secondary | ICD-10-CM

## 2024-06-12 DIAGNOSIS — Z0000 Encounter for general adult medical examination without abnormal findings: Secondary | ICD-10-CM

## 2024-06-12 DIAGNOSIS — E66811 Obesity, class 1: Secondary | ICD-10-CM | POA: Diagnosis not present

## 2024-06-12 LAB — LIPID PANEL
Cholesterol: 147 mg/dL (ref 0–200)
HDL: 56.5 mg/dL (ref >39.00)
LDL Cholesterol: 67 mg/dL (ref 0–99)
NonHDL: 90.86
Total CHOL/HDL Ratio: 3
Triglycerides: 120 mg/dL (ref 0.0–149.0)
VLDL: 24 mg/dL (ref 0.0–40.0)

## 2024-06-12 LAB — CBC WITH DIFFERENTIAL/PLATELET
Basophils Absolute: 0.1 K/uL (ref 0.0–0.1)
Basophils Relative: 1.2 % (ref 0.0–3.0)
Eosinophils Absolute: 0.3 K/uL (ref 0.0–0.7)
Eosinophils Relative: 3.5 % (ref 0.0–5.0)
HCT: 45.5 % (ref 39.0–52.0)
Hemoglobin: 15.3 g/dL (ref 13.0–17.0)
Lymphocytes Relative: 28.2 % (ref 12.0–46.0)
Lymphs Abs: 2 K/uL (ref 0.7–4.0)
MCHC: 33.7 g/dL (ref 30.0–36.0)
MCV: 90.9 fl (ref 78.0–100.0)
Monocytes Absolute: 0.7 K/uL (ref 0.1–1.0)
Monocytes Relative: 9.5 % (ref 3.0–12.0)
Neutro Abs: 4.2 K/uL (ref 1.4–7.7)
Neutrophils Relative %: 57.6 % (ref 43.0–77.0)
Platelets: 233 K/uL (ref 150.0–400.0)
RBC: 5 Mil/uL (ref 4.22–5.81)
RDW: 12.9 % (ref 11.5–15.5)
WBC: 7.2 K/uL (ref 4.0–10.5)

## 2024-06-12 LAB — URINALYSIS, MICROSCOPIC ONLY
RBC / HPF: NONE SEEN (ref 0–?)
WBC, UA: NONE SEEN (ref 0–?)

## 2024-06-12 LAB — COMPREHENSIVE METABOLIC PANEL WITH GFR
ALT: 18 U/L (ref 0–53)
AST: 15 U/L (ref 0–37)
Albumin: 4.9 g/dL (ref 3.5–5.2)
Alkaline Phosphatase: 52 U/L (ref 39–117)
BUN: 11 mg/dL (ref 6–23)
CO2: 32 meq/L (ref 19–32)
Calcium: 9.9 mg/dL (ref 8.4–10.5)
Chloride: 102 meq/L (ref 96–112)
Creatinine, Ser: 0.95 mg/dL (ref 0.40–1.50)
GFR: 97.16 mL/min (ref >60.00)
Glucose, Bld: 94 mg/dL (ref 70–99)
Potassium: 4.1 meq/L (ref 3.5–5.1)
Sodium: 142 meq/L (ref 135–145)
Total Bilirubin: 0.8 mg/dL (ref 0.2–1.2)
Total Protein: 7.1 g/dL (ref 6.0–8.3)

## 2024-06-12 LAB — TSH: TSH: 2.9 u[IU]/mL (ref 0.35–5.50)

## 2024-06-12 LAB — MICROALBUMIN / CREATININE URINE RATIO
Creatinine,U: 84.4 mg/dL
Microalb Creat Ratio: UNDETERMINED mg/g (ref 0.0–30.0)
Microalb, Ur: <0.7 mg/dL

## 2024-06-12 LAB — HEMOGLOBIN A1C: Hgb A1c MFr Bld: 5.7 % (ref 4.6–6.5)

## 2024-06-12 MED ORDER — MELOXICAM 15 MG PO TABS: 15.0000 mg | ORAL_TABLET | Freq: Every day | ORAL | 0 refills | Status: AC

## 2024-06-12 NOTE — Assessment & Plan Note (Signed)
 History of the same patient currently maintained on atorvastatin  40 mg daily.  Pending lipid panel

## 2024-06-12 NOTE — Assessment & Plan Note (Signed)
 History of the same.  Patient has a CPAP but does not wear it every night.  Continue CPAP as directed

## 2024-06-12 NOTE — Assessment & Plan Note (Signed)
 History of same currently maintained on losartan  100 mg daily and hydrochlorothiazide  25 mg daily.  Blood pressure has been well-controlled outside the office in the past couple checks.  Parameters given he can check blood pressure at work.  Continue medication as prescribed

## 2024-06-12 NOTE — Patient Instructions (Signed)
 Nice to see you today  I will be in touch with the labs once I have them Follow up with me in 1 year, sooner if you need me   We did up date your flu vaccine today  I have sent in an anti-inflammatory medication. Take it every day with forood for the next 7-10 days, then you can take it as needed there after

## 2024-06-12 NOTE — Assessment & Plan Note (Signed)
 Discussed age-appropriate immunizations and screening exams.  Did review patient's personal, surgical, social, family histories.  Patient is up-to-date with all age-appropriate vaccinations and would like.  Update flu vaccine today.  Patient is up-to-date on CRC screening.  Too young for prostate cancer screening.  Patient was given information at discharge about preventative healthcare maintenance with anticipatory guidance.

## 2024-06-12 NOTE — Assessment & Plan Note (Signed)
 History of same.  Patient has lost over 30 pounds.  Continue work on healthy lifestyle modifications inclusive of diet and exercise

## 2024-06-12 NOTE — Progress Notes (Signed)
 Established Patient Office Visit  Subjective   Patient ID: Jerry Leach, male    DOB: 06/17/1981  Age: 43 y.o. MRN: [id number]  Chief Complaint  Patient presents with   Transitions Of Care    Flu vaccine     HPI  HTN: Patient currently maintained on hydrochlorothiazide  25 mg daily and losartan  100 mg daily. States that he does not check BP at home but did take medicaton   HLD: Currently maintained on atorvastatin  40 mg daily  OSA: has a machine and is mild. He will use it when really tired. States that sleep varies. He will get 4-8 hours of sleep depending on work schedule  for complete physical and follow up of chronic conditions.  Immunizations: -Tetanus: Completed in 2015, needs updating.  Patient had 1 since 2015. -Influenza: update today  -Shingles: Too young -Pneumonia: Too young  Diet: Fair diet.  He will eat 2 meals a day. Does not snack. He drinks water and beer Exercise: No regular exercise. He will do the eliptical 3-4 times 30-35 mins   Eye exam: Completes annually. Glasses. Dental exam: Completes semi-annually    Colonoscopy: Completed in 06/21/2015, repeat 5 to 10 years?  Has had two  last 2024 with 7 year recall. Jerry Leach  Lung Cancer Screening: Former smoker too young for LDCT.  PSA: Too young, currently average risk  Groin/hip: states that the past week he is having some pain in the hip that sharp stabbing pain or is sitting for a while and then getting          Review of Systems  Constitutional:  Negative for chills and fever.  Respiratory:  Negative for shortness of breath.   Cardiovascular:  Negative for chest pain and leg swelling.  Gastrointestinal:  Negative for abdominal pain, blood in stool, constipation, diarrhea, nausea and vomiting.       Bm daily  Genitourinary:  Negative for dysuria and hematuria.  Musculoskeletal:  Positive for joint pain.  Neurological:  Positive for tingling. Negative for dizziness and headaches.   Psychiatric/Behavioral:  Negative for hallucinations and suicidal ideas.       Objective:     BP (!) 132/90   Pulse 76   Temp 98 F (36.7 C) (Oral)   Ht 5' 8.25 (1.734 m)   Wt 214 lb (97.1 kg)   SpO2 99%   BMI 32.30 kg/m  BP Readings from Last 3 Encounters:  06/12/24 (!) 132/90  04/12/24 120/78  01/11/24 138/88   Wt Readings from Last 3 Encounters:  06/12/24 214 lb (97.1 kg)  04/12/24 216 lb 9.6 oz (98.2 kg)  12/25/23 230 lb (104.3 kg)   SpO2 Readings from Last 3 Encounters:  06/12/24 99%  04/12/24 99%  01/11/24 99%      Physical Exam Vitals and nursing note reviewed.  Constitutional:      Appearance: Normal appearance.  HENT:     Right Ear: Tympanic membrane, ear canal and external ear normal.     Left Ear: Tympanic membrane, ear canal and external ear normal.     Mouth/Throat:     Mouth: Mucous membranes are moist.     Pharynx: Oropharynx is clear.  Eyes:     Extraocular Movements: Extraocular movements intact.     Pupils: Pupils are equal, round, and reactive to light.  Cardiovascular:     Rate and Rhythm: Normal rate and regular rhythm.     Pulses: Normal pulses.     Heart sounds: Normal heart  sounds.  Pulmonary:     Effort: Pulmonary effort is normal.     Breath sounds: Normal breath sounds.  Abdominal:     General: Bowel sounds are normal. There is no distension.     Palpations: There is no mass.     Tenderness: There is no abdominal tenderness.     Hernia: No hernia is present.  Musculoskeletal:        General: Tenderness present.     Right lower leg: No edema.     Left lower leg: No edema.       Legs:     Comments: Pain with hip abduction and abduction.  Lymphadenopathy:     Cervical: No cervical adenopathy.  Skin:    General: Skin is warm.  Neurological:     General: No focal deficit present.     Mental Status: He is alert.     Deep Tendon Reflexes:     Reflex Scores:      Bicep reflexes are 2+ on the right side and 2+ on the left  side.      Patellar reflexes are 2+ on the right side and 2+ on the left side.    Comments: Bilateral upper and lower extremity strength 5/5  Psychiatric:        Mood and Affect: Mood normal.        Behavior: Behavior normal.        Thought Content: Thought content normal.        Judgment: Judgment normal.      No results found for any visits on 06/12/24.    The 10-year ASCVD risk score (Arnett DK, et al., 2019) is: 2.5%    Assessment & Plan:   Problem List Items Addressed This Visit       Cardiovascular and Mediastinum   Hypertension   History of same currently maintained on losartan  100 mg daily and hydrochlorothiazide  25 mg daily.  Blood pressure has been well-controlled outside the office in the past couple checks.  Parameters given he can check blood pressure at work.  Continue medication as prescribed      Relevant Orders   CBC with Differential/Platelet   Comprehensive metabolic panel with GFR   Hemoglobin A1c   TSH   Microalbumin / creatinine urine ratio   Lipid panel     Respiratory   OSA (obstructive sleep apnea)   History of the same.  Patient has a CPAP but does not wear it every night.  Continue CPAP as directed        Other   Routine general medical examination at a health care facility - Primary   Discussed age-appropriate immunizations and screening exams.  Did review patient's personal, surgical, social, family histories.  Patient is up-to-date with all age-appropriate vaccinations and would like.  Update flu vaccine today.  Patient is up-to-date on CRC screening.  Too young for prostate cancer screening.  Patient was given information at discharge about preventative healthcare maintenance with anticipatory guidance.      Relevant Orders   CBC with Differential/Platelet   Comprehensive metabolic panel with GFR   TSH   Hyperlipidemia   History of the same patient currently maintained on atorvastatin  40 mg daily.  Pending lipid panel       Relevant Orders   Hemoglobin A1c   Lipid panel   Obesity (BMI 30.0-34.9)   History of same.  Patient has lost over 30 pounds.  Continue work on healthy lifestyle modifications inclusive of diet and  exercise      Relevant Orders   Hemoglobin A1c   Lipid panel   Other Visit Diagnoses       Screening for diabetes mellitus       Relevant Orders   Hemoglobin A1c     Former tobacco use       Relevant Orders   Urine Microscopic     Strain of adductor muscle of right thigh       Relevant Medications   meloxicam  (MOBIC ) 15 MG tablet       Return in about 1 year (around 06/12/2025) for CPE and Labs.    Adina Crandall, NP

## 2024-06-14 ENCOUNTER — Telehealth: Payer: Self-pay

## 2024-06-14 NOTE — Telephone Encounter (Signed)
 LAST APPOINTMENT DATE: 06/12/2024 routine with cable NEXT APPOINTMENT DATE: 06/13/25  LAST REFILL: 06/09/23 Atorvastatin  40 mg QTY: #90, 3RF  Hydrochlorothiazide  25 mg QTY: #90, 3RF  Losartan  100 mg  QTY: #90, 3RF

## 2024-06-19 ENCOUNTER — Ambulatory Visit: Payer: Self-pay | Admitting: Nurse Practitioner

## 2024-06-19 MED ORDER — HYDROCHLOROTHIAZIDE 25 MG PO TABS: ORAL_TABLET | ORAL | 3 refills | Status: AC

## 2024-06-19 MED ORDER — LOSARTAN POTASSIUM 100 MG PO TABS: ORAL_TABLET | ORAL | 3 refills | Status: AC

## 2024-06-19 MED ORDER — ATORVASTATIN CALCIUM 40 MG PO TABS: 40.0000 mg | ORAL_TABLET | Freq: Every day | ORAL | 3 refills | Status: AC

## 2024-06-19 NOTE — Telephone Encounter (Signed)
 Refills provided

## 2024-06-19 NOTE — Addendum Note (Signed)
 Addended by: WENDEE LYNWOOD HERO on: 06/19/2024 12:59 PM   Modules accepted: Orders

## 2024-08-24 ENCOUNTER — Other Ambulatory Visit (HOSPITAL_BASED_OUTPATIENT_CLINIC_OR_DEPARTMENT_OTHER): Payer: Self-pay | Admitting: Family Medicine

## 2024-08-24 DIAGNOSIS — Z8249 Family history of ischemic heart disease and other diseases of the circulatory system: Secondary | ICD-10-CM

## 2024-09-15 ENCOUNTER — Other Ambulatory Visit (HOSPITAL_BASED_OUTPATIENT_CLINIC_OR_DEPARTMENT_OTHER)

## 2025-06-13 ENCOUNTER — Encounter: Admitting: Nurse Practitioner
